# Patient Record
Sex: Male | Born: 1945 | Race: White | Hispanic: No
Health system: Southern US, Community
[De-identification: ages and names within clinical notes are randomized; demographics above are authoritative.]

---

## 2016-06-29 ENCOUNTER — Other Ambulatory Visit (HOSPITAL_COMMUNITY): Payer: Federal, State, Local not specified - PPO

## 2016-06-29 ENCOUNTER — Inpatient Hospital Stay
Admit: 2016-06-29 | Discharge: 2016-08-07 | Disposition: A | Discharge status: Hospice | Payer: Federal, State, Local not specified - PPO | Source: Other Acute Inpatient Hospital | Attending: Internal Medicine | Admitting: Internal Medicine

## 2016-06-29 ENCOUNTER — Ambulatory Visit (HOSPITAL_COMMUNITY)
Admission: AD | Admit: 2016-06-29 | Discharge: 2016-06-29 | Disposition: A | Discharge status: Home / Self Care | Payer: Federal, State, Local not specified - PPO | Source: Other Acute Inpatient Hospital | Attending: Internal Medicine | Admitting: Internal Medicine

## 2016-06-29 DIAGNOSIS — Z9289 Personal history of other medical treatment: Secondary | ICD-10-CM

## 2016-06-29 DIAGNOSIS — R918 Other nonspecific abnormal finding of lung field: Secondary | ICD-10-CM

## 2016-06-29 DIAGNOSIS — T8579XA Infection and inflammatory reaction due to other internal prosthetic devices, implants and grafts, initial encounter: Secondary | ICD-10-CM

## 2016-06-29 DIAGNOSIS — T827XXA Infection and inflammatory reaction due to other cardiac and vascular devices, implants and grafts, initial encounter: Secondary | ICD-10-CM

## 2016-06-29 DIAGNOSIS — J90 Pleural effusion, not elsewhere classified: Secondary | ICD-10-CM

## 2016-06-29 DIAGNOSIS — J189 Pneumonia, unspecified organism: Secondary | ICD-10-CM

## 2016-06-29 DIAGNOSIS — Z4659 Encounter for fitting and adjustment of other gastrointestinal appliance and device: Secondary | ICD-10-CM

## 2016-06-29 DIAGNOSIS — Z992 Dependence on renal dialysis: Secondary | ICD-10-CM

## 2016-06-29 DIAGNOSIS — J811 Chronic pulmonary edema: Secondary | ICD-10-CM

## 2016-06-29 DIAGNOSIS — J9690 Respiratory failure, unspecified, unspecified whether with hypoxia or hypercapnia: Secondary | ICD-10-CM

## 2016-06-29 DIAGNOSIS — R100 Acute abdomen: Secondary | ICD-10-CM

## 2016-06-29 DIAGNOSIS — Z452 Encounter for adjustment and management of vascular access device: Secondary | ICD-10-CM

## 2016-06-29 DIAGNOSIS — R1310 Dysphagia, unspecified: Secondary | ICD-10-CM

## 2016-06-29 DIAGNOSIS — Z931 Gastrostomy status: Secondary | ICD-10-CM

## 2016-06-29 DIAGNOSIS — R609 Edema, unspecified: Secondary | ICD-10-CM

## 2016-06-30 LAB — CBC WITH DIFFERENTIAL/PLATELET
BASOS PCT: 0 %
Basophils Absolute: 0 10*3/uL (ref 0.0–0.1)
EOS ABS: 0.2 10*3/uL (ref 0.0–0.7)
Eosinophils Relative: 1 %
HEMATOCRIT: 24.7 % — AB (ref 39.0–52.0)
Hemoglobin: 7.9 g/dL — ABNORMAL LOW (ref 13.0–17.0)
LYMPHS ABS: 0.9 10*3/uL (ref 0.7–4.0)
Lymphocytes Relative: 5 %
MCH: 28.3 pg (ref 26.0–34.0)
MCHC: 32 g/dL (ref 30.0–36.0)
MCV: 88.5 fL (ref 78.0–100.0)
MONO ABS: 0.8 10*3/uL (ref 0.1–1.0)
MONOS PCT: 4 %
Neutro Abs: 18.1 10*3/uL — ABNORMAL HIGH (ref 1.7–7.7)
Neutrophils Relative %: 90 %
Platelets: 247 10*3/uL (ref 150–400)
RBC: 2.79 MIL/uL — ABNORMAL LOW (ref 4.22–5.81)
RDW: 15.1 % (ref 11.5–15.5)
WBC: 20 10*3/uL — ABNORMAL HIGH (ref 4.0–10.5)

## 2016-06-30 LAB — CK TOTAL AND CKMB (NOT AT ARMC)
CK, MB: 6.1 ng/mL — ABNORMAL HIGH (ref 0.5–5.0)
RELATIVE INDEX: 5.3 — AB (ref 0.0–2.5)
Total CK: 115 U/L (ref 49–397)

## 2016-06-30 LAB — COMPREHENSIVE METABOLIC PANEL
ALBUMIN: 1.7 g/dL — AB (ref 3.5–5.0)
ALT: 12 U/L — ABNORMAL LOW (ref 17–63)
AST: 19 U/L (ref 15–41)
Alkaline Phosphatase: 86 U/L (ref 38–126)
Anion gap: 12 (ref 5–15)
BILIRUBIN TOTAL: 1.1 mg/dL (ref 0.3–1.2)
BUN: 38 mg/dL — ABNORMAL HIGH (ref 6–20)
CO2: 20 mmol/L — ABNORMAL LOW (ref 22–32)
Calcium: 7.9 mg/dL — ABNORMAL LOW (ref 8.9–10.3)
Chloride: 100 mmol/L — ABNORMAL LOW (ref 101–111)
Creatinine, Ser: 2.15 mg/dL — ABNORMAL HIGH (ref 0.61–1.24)
GFR, EST AFRICAN AMERICAN: 34 mL/min — AB (ref >60)
GFR, EST NON AFRICAN AMERICAN: 29 mL/min — AB (ref >60)
Glucose, Bld: 223 mg/dL — ABNORMAL HIGH (ref 65–99)
POTASSIUM: 5.8 mmol/L — AB (ref 3.5–5.1)
Sodium: 132 mmol/L — ABNORMAL LOW (ref 135–145)
TOTAL PROTEIN: 5.6 g/dL — AB (ref 6.5–8.1)

## 2016-06-30 LAB — URINALYSIS, ROUTINE W REFLEX MICROSCOPIC
BILIRUBIN URINE: NEGATIVE
Glucose, UA: 50 mg/dL — AB
Hgb urine dipstick: NEGATIVE
Ketones, ur: NEGATIVE mg/dL
Leukocytes, UA: NEGATIVE
Nitrite: NEGATIVE
Protein, ur: 30 mg/dL — AB
SPECIFIC GRAVITY, URINE: 1.018 (ref 1.005–1.030)
pH: 5 (ref 5.0–8.0)

## 2016-06-30 LAB — BLOOD GAS, ARTERIAL
ACID-BASE DEFICIT: 6.3 mmol/L — AB (ref 0.0–2.0)
BICARBONATE: 18.7 mmol/L — AB (ref 20.0–28.0)
Delivery systems: POSITIVE
EXPIRATORY PAP: 8
FIO2: 50
Inspiratory PAP: 16
Mode: POSITIVE
O2 Saturation: 92.9 %
PH ART: 7.316 — AB (ref 7.350–7.450)
Patient temperature: 98.6
pCO2 arterial: 37.7 mmHg (ref 32.0–48.0)
pO2, Arterial: 72.9 mmHg — ABNORMAL LOW (ref 83.0–108.0)

## 2016-06-30 LAB — TSH: TSH: 1.199 u[IU]/mL (ref 0.350–4.500)

## 2016-06-30 LAB — PROTIME-INR
INR: 1.55
Prothrombin Time: 18.8 seconds — ABNORMAL HIGH (ref 11.4–15.2)

## 2016-06-30 LAB — SEDIMENTATION RATE: SED RATE: 98 mm/h — AB (ref 0–16)

## 2016-06-30 LAB — C-REACTIVE PROTEIN: CRP: 24.8 mg/dL — AB (ref <1.0)

## 2016-06-30 LAB — PHOSPHORUS: Phosphorus: 5.3 mg/dL — ABNORMAL HIGH (ref 2.5–4.6)

## 2016-06-30 LAB — MAGNESIUM: MAGNESIUM: 1.5 mg/dL — AB (ref 1.7–2.4)

## 2016-07-01 ENCOUNTER — Other Ambulatory Visit (HOSPITAL_COMMUNITY): Payer: Federal, State, Local not specified - PPO

## 2016-07-01 ENCOUNTER — Encounter: Payer: Federal, State, Local not specified - PPO | Admitting: Certified Registered Nurse Anesthetist

## 2016-07-01 LAB — BLOOD GAS, ARTERIAL
ACID-BASE DEFICIT: 3.8 mmol/L — AB (ref 0.0–2.0)
ACID-BASE DEFICIT: 5.5 mmol/L — AB (ref 0.0–2.0)
Acid-base deficit: 4.9 mmol/L — ABNORMAL HIGH (ref 0.0–2.0)
BICARBONATE: 20.7 mmol/L (ref 20.0–28.0)
BICARBONATE: 19.7 mmol/L — AB (ref 20.0–28.0)
Bicarbonate: 21 mmol/L (ref 20.0–28.0)
DELIVERY SYSTEMS: POSITIVE
EXPIRATORY PAP: 8
FIO2: 100
FIO2: 100
FIO2: 80
INSPIRATORY PAP: 16
LHR: 18 {breaths}/min
O2 SAT: 94.4 %
O2 Saturation: 96.6 %
O2 Saturation: 98.7 %
PATIENT TEMPERATURE: 98.6
PCO2 ART: 45.2 mmHg (ref 32.0–48.0)
PEEP/CPAP: 12 cmH2O
PEEP: 12 cmH2O
PH ART: 7.339 — AB (ref 7.350–7.450)
PIP: 16 cmH2O
PO2 ART: 83.3 mmHg (ref 83.0–108.0)
PRESSURE CONTROL: 16 cmH2O
Patient temperature: 98.6
Patient temperature: 98.6
RATE: 24 resp/min
pCO2 arterial: 40.1 mmHg (ref 32.0–48.0)
pCO2 arterial: 40.1 mmHg (ref 32.0–48.0)
pH, Arterial: 7.282 — ABNORMAL LOW (ref 7.350–7.450)
pH, Arterial: 7.312 — ABNORMAL LOW (ref 7.350–7.450)
pO2, Arterial: 98.5 mmHg (ref 83.0–108.0)
pO2, Arterial: 141 mmHg — ABNORMAL HIGH (ref 83.0–108.0)

## 2016-07-01 LAB — CBC WITH DIFFERENTIAL/PLATELET
Basophils Absolute: 0 10*3/uL (ref 0.0–0.1)
Basophils Relative: 0 %
EOS ABS: 0 10*3/uL (ref 0.0–0.7)
Eosinophils Relative: 0 %
HCT: 27 % — ABNORMAL LOW (ref 39.0–52.0)
HEMOGLOBIN: 8.6 g/dL — AB (ref 13.0–17.0)
LYMPHS ABS: 0.6 10*3/uL — AB (ref 0.7–4.0)
LYMPHS PCT: 3 %
MCH: 27.9 pg (ref 26.0–34.0)
MCHC: 31.9 g/dL (ref 30.0–36.0)
MCV: 87.7 fL (ref 78.0–100.0)
Monocytes Absolute: 1.1 10*3/uL — ABNORMAL HIGH (ref 0.1–1.0)
Monocytes Relative: 6 %
NEUTROS PCT: 91 %
Neutro Abs: 17.2 10*3/uL — ABNORMAL HIGH (ref 1.7–7.7)
Platelets: 311 10*3/uL (ref 150–400)
RBC: 3.08 MIL/uL — AB (ref 4.22–5.81)
RDW: 15.4 % (ref 11.5–15.5)
WBC: 19 10*3/uL — AB (ref 4.0–10.5)

## 2016-07-01 LAB — RENAL FUNCTION PANEL
ANION GAP: 14 (ref 5–15)
Albumin: 1.7 g/dL — ABNORMAL LOW (ref 3.5–5.0)
BUN: 53 mg/dL — ABNORMAL HIGH (ref 6–20)
CALCIUM: 8 mg/dL — AB (ref 8.9–10.3)
CO2: 19 mmol/L — AB (ref 22–32)
Chloride: 102 mmol/L (ref 101–111)
Creatinine, Ser: 2.3 mg/dL — ABNORMAL HIGH (ref 0.61–1.24)
GFR, EST AFRICAN AMERICAN: 31 mL/min — AB (ref >60)
GFR, EST NON AFRICAN AMERICAN: 27 mL/min — AB (ref >60)
Glucose, Bld: 272 mg/dL — ABNORMAL HIGH (ref 65–99)
PHOSPHORUS: 5.5 mg/dL — AB (ref 2.5–4.6)
Potassium: 4.4 mmol/L (ref 3.5–5.1)
SODIUM: 135 mmol/L (ref 135–145)

## 2016-07-01 LAB — URINE CULTURE: Culture: NO GROWTH

## 2016-07-01 LAB — HEMOGLOBIN A1C
Hgb A1c MFr Bld: 7.3 % — ABNORMAL HIGH (ref 4.8–5.6)
MEAN PLASMA GLUCOSE: 163 mg/dL

## 2016-07-01 LAB — MAGNESIUM: MAGNESIUM: 1.8 mg/dL (ref 1.7–2.4)

## 2016-07-01 MED ORDER — ETOMIDATE 2 MG/ML IV SOLN
INTRAVENOUS | Status: DC | PRN
  Administered 2016-07-01: 12 mg via INTRAVENOUS

## 2016-07-01 MED ORDER — SUCCINYLCHOLINE CHLORIDE 20 MG/ML IJ SOLN
INTRAMUSCULAR | Status: DC | PRN
  Administered 2016-07-01: 100 mg via INTRAVENOUS

## 2016-07-02 LAB — BLOOD GAS, ARTERIAL
ACID-BASE DEFICIT: 3.1 mmol/L — AB (ref 0.0–2.0)
Bicarbonate: 22.1 mmol/L (ref 20.0–28.0)
FIO2: 70
O2 SAT: 89.1 %
PEEP: 12 cmH2O
PH ART: 7.315 — AB (ref 7.350–7.450)
Patient temperature: 98.6
Pressure control: 16 cmH2O
RATE: 24 resp/min
pCO2 arterial: 44.6 mmHg (ref 32.0–48.0)
pO2, Arterial: 63.4 mmHg — ABNORMAL LOW (ref 83.0–108.0)

## 2016-07-02 LAB — RENAL FUNCTION PANEL
ALBUMIN: 1.5 g/dL — AB (ref 3.5–5.0)
ANION GAP: 11 (ref 5–15)
BUN: 65 mg/dL — AB (ref 6–20)
CALCIUM: 7.7 mg/dL — AB (ref 8.9–10.3)
CO2: 21 mmol/L — ABNORMAL LOW (ref 22–32)
Chloride: 102 mmol/L (ref 101–111)
Creatinine, Ser: 2.41 mg/dL — ABNORMAL HIGH (ref 0.61–1.24)
GFR calc Af Amer: 30 mL/min — ABNORMAL LOW (ref >60)
GFR, EST NON AFRICAN AMERICAN: 26 mL/min — AB (ref >60)
GLUCOSE: 367 mg/dL — AB (ref 65–99)
PHOSPHORUS: 5.7 mg/dL — AB (ref 2.5–4.6)
POTASSIUM: 4.4 mmol/L (ref 3.5–5.1)
SODIUM: 134 mmol/L — AB (ref 135–145)

## 2016-07-02 LAB — MAGNESIUM: Magnesium: 1.8 mg/dL (ref 1.7–2.4)

## 2016-07-02 LAB — CBC WITH DIFFERENTIAL/PLATELET
BASOS ABS: 0 10*3/uL (ref 0.0–0.1)
BASOS PCT: 0 %
Eosinophils Absolute: 0.2 10*3/uL (ref 0.0–0.7)
Eosinophils Relative: 1 %
HEMATOCRIT: 23.4 % — AB (ref 39.0–52.0)
HEMOGLOBIN: 7.4 g/dL — AB (ref 13.0–17.0)
LYMPHS PCT: 2 %
Lymphs Abs: 0.3 10*3/uL — ABNORMAL LOW (ref 0.7–4.0)
MCH: 27.7 pg (ref 26.0–34.0)
MCHC: 31.6 g/dL (ref 30.0–36.0)
MCV: 87.6 fL (ref 78.0–100.0)
Monocytes Absolute: 0.9 10*3/uL (ref 0.1–1.0)
Monocytes Relative: 6 %
NEUTROS PCT: 91 %
Neutro Abs: 14.6 10*3/uL — ABNORMAL HIGH (ref 1.7–7.7)
Platelets: 267 10*3/uL (ref 150–400)
RBC: 2.67 MIL/uL — AB (ref 4.22–5.81)
RDW: 15.5 % (ref 11.5–15.5)
WBC: 16.1 10*3/uL — AB (ref 4.0–10.5)

## 2016-07-03 ENCOUNTER — Other Ambulatory Visit (HOSPITAL_COMMUNITY): Payer: Federal, State, Local not specified - PPO

## 2016-07-03 LAB — BASIC METABOLIC PANEL
ANION GAP: 12 (ref 5–15)
BUN: 76 mg/dL — ABNORMAL HIGH (ref 6–20)
CALCIUM: 8.1 mg/dL — AB (ref 8.9–10.3)
CO2: 22 mmol/L (ref 22–32)
Chloride: 101 mmol/L (ref 101–111)
Creatinine, Ser: 2.76 mg/dL — ABNORMAL HIGH (ref 0.61–1.24)
GFR, EST AFRICAN AMERICAN: 25 mL/min — AB (ref >60)
GFR, EST NON AFRICAN AMERICAN: 22 mL/min — AB (ref >60)
Glucose, Bld: 388 mg/dL — ABNORMAL HIGH (ref 65–99)
Potassium: 5 mmol/L (ref 3.5–5.1)
SODIUM: 135 mmol/L (ref 135–145)

## 2016-07-03 LAB — BLOOD GAS, ARTERIAL
ACID-BASE DEFICIT: 2.7 mmol/L — AB (ref 0.0–2.0)
Acid-base deficit: 2.4 mmol/L — ABNORMAL HIGH (ref 0.0–2.0)
BICARBONATE: 22.1 mmol/L (ref 20.0–28.0)
Bicarbonate: 23.2 mmol/L (ref 20.0–28.0)
FIO2: 100
FIO2: 100
O2 Saturation: 90.4 %
O2 Saturation: 93.8 %
PATIENT TEMPERATURE: 98.6
PCO2 ART: 50 mmHg — AB (ref 32.0–48.0)
PEEP: 12 cmH2O
PEEP: 12 cmH2O
PIP: 24 cmH2O
Patient temperature: 98.6
RATE: 24 resp/min
RATE: 28 resp/min
pCO2 arterial: 41.2 mmHg (ref 32.0–48.0)
pH, Arterial: 7.288 — ABNORMAL LOW (ref 7.350–7.450)
pH, Arterial: 7.348 — ABNORMAL LOW (ref 7.350–7.450)
pO2, Arterial: 67.5 mmHg — ABNORMAL LOW (ref 83.0–108.0)
pO2, Arterial: 73.1 mmHg — ABNORMAL LOW (ref 83.0–108.0)

## 2016-07-03 LAB — MAGNESIUM: MAGNESIUM: 2 mg/dL (ref 1.7–2.4)

## 2016-07-03 NOTE — Consult Note (Signed)
Date: 07/03/2016                  Patient Name:  Taylor Maldonado  MRN: [id number]  DOB: 18-Mar-1946  Age / Sex: 71 y.o., male         PCP: No primary care provider on file.                 Service Requesting Consult: Internal Medicine/ [facility]                 Reason for Consult: Acute renal failure            History of Present Illness: Patient is a 71 y.o. male with medical problems of A Fib, Diabetes with complications of retinopathy, GERD, MAI (04/2016), OSA, h/o back surgery, osteomyelitis of left foot who was admitted  on 06/[facility] of Rt BKA   Patient has has chronic bilateral lower extremity wounds and peripheral vasclar disease. He underwent rt BKA for a non healing rt LE wound. Post op course was complicated by respiratory failure vs ARDS. He underwent thoracentesis for large pleural effusion.   Patient has h/o single functioning kidney. d/c Creatinine was 1.27   Urgent consult is requested today for ARF and acute respiratiry failure, Patient was intubated for respiratory distress. He is currently on lasix drip. Fio2 is 100% and PEEP had to be increased to 15    Medications: Outpatient medications: No prescriptions prior to admission.    Current medications: No current facility-administered medications for this encounter.     Atorvastatin azithromycin Buspirone Carvedilol Clonidine Diltiazem Cubicin Ethambutol Famotidine Ferrous sulfate Lasix iv infusion at 12 mg/hr Hydralazine Lantus Miralax NTG Sertraline Trazodone Zinc Sulfate  Allergies: Allergies not on file   Codeine, clindamycin, percocet, sulfa, aspirin, Keflex, augmentin  Past Medical History: No past medical history on file. As above  Past Surgical History: No past surgical history on file.   Family History: No family history on file.   Social History: Social History   Social History  . Marital  status: Unknown    Spouse name: N/A  . Number of children: N/A  . Years of education: N/A   Occupational History  . Not on file.   Social History Main Topics  . Smoking status: Not on file  . Smokeless tobacco: Not on file  . Alcohol use Not on file  . Drug use: Unknown  . Sexual activity: Not on file   Other Topics Concern  . Not on file   Social History Narrative  . No narrative on file     Review of Systems: not available as patient is critically ill intubated and sedated Gen:  HEENT:  CV:  Resp:  GI: GU :  MS:  Derm:   Psych: Heme:  Neuro:  Endocrine  Vital Signs:  T 99 P 81  RR 24  BP 162/75  Weight trends: There were no vitals filed for this visit.  Physical Exam: General:  Critically ill appearing gentleman, laying in bed  HEENT ETT, OGT in place  Neck: No mass  Lungs: Vent assisted, Fio2 100%, PEEP 5, coarse, diminished at bases  Heart::  no rub  Abdomen: Soft, non tender, non distended,   Extremities:  ++ dependent edema, rt BKA, staples intact  Neurologic: sedated  Skin: No acute rashes  Access: -   Foley: Present       Lab results: Basic  Metabolic Panel:  Recent Labs Lab 06/30/16 0450 07/01/16 0652 07/02/16 0713 07/03/16 1042  NA 132* 135 134* 135  K 5.8* 4.4 4.4 5.0  CL 100* 102 102 101  CO2 20* 19* 21* 22  GLUCOSE 223* 272* 367* 388*  BUN 38* 53* 65* 76*  CREATININE 2.15* 2.30* 2.41* 2.76*  CALCIUM 7.9* 8.0* 7.7* 8.1*  MG 1.5* 1.8 1.8 2.0  PHOS 5.3* 5.5* 5.7*  --     Liver Function Tests:  Recent Labs Lab 06/30/16 0450  07/02/16 0713  AST 19  --   --   ALT 12*  --   --   ALKPHOS 86  --   --   BILITOT 1.1  --   --   PROT 5.6*  --   --   ALBUMIN 1.7*  < > 1.5*  < > = values in this interval not displayed. No results for input(s): LIPASE, AMYLASE in the last 168 hours. No results for input(s): AMMONIA in the last 168 hours.  CBC:  Recent Labs Lab 07/01/16 0652 07/02/16 0713  WBC 19.0* 16.1*  NEUTROABS  17.2* 14.6*  HGB 8.6* 7.4*  HCT 27.0* 23.4*  MCV 87.7 87.6  PLT 311 267    Cardiac Enzymes:  Recent Labs Lab 06/30/16 0450  CKTOTAL 115    BNP: Invalid input(s): POCBNP  CBG: No results for input(s): GLUCAP in the last 168 hours.  Microbiology: Recent Results (from the past 720 hour(s))  Culture, blood (routine x 2)     Status: None (Preliminary result)   Collection Time: 06/29/16  6:12 PM  Result Value Ref Range Status   Specimen Description BLOOD LEFT HAND  Final   Special Requests IN PEDIATRIC BOTTLE 1CC  Final   Culture NO GROWTH 4 DAYS  Final   Report Status PENDING  Incomplete  Culture, blood (routine x 2)     Status: None (Preliminary result)   Collection Time: 06/29/16  6:23 PM  Result Value Ref Range Status   Specimen Description BLOOD RIGHT HAND  Final   Special Requests BOTTLES DRAWN AEROBIC AND ANAEROBIC 5CC  Final   Culture NO GROWTH 4 DAYS  Final   Report Status PENDING  Incomplete  Urine culture     Status: None   Collection Time: 06/30/16  7:39 AM  Result Value Ref Range Status   Specimen Description URINE, RANDOM  Final   Special Requests NONE  Final   Culture NO GROWTH  Final   Report Status 07/01/2016 FINAL  Final     Coagulation Studies: No results for input(s): LABPROT, INR in the last 72 hours.  Urinalysis: No results for input(s): COLORURINE, LABSPEC, PHURINE, GLUCOSEU, HGBUR, BILIRUBINUR, KETONESUR, PROTEINUR, UROBILINOGEN, NITRITE, LEUKOCYTESUR in the last 72 hours.  Invalid input(s): APPERANCEUR      Imaging: Dg Chest Port 1 View  Result Date: 07/03/2016 CLINICAL DATA:  Endotracheal tube.  Pulmonary edema. EXAM: PORTABLE CHEST 1 VIEW COMPARISON:  One-view chest x-ray 07/01/2016 FINDINGS: Endotracheal tube now terminates 3.3 cm above the carina, in satisfactory position. Right upper lobe airspace consolidation has progressed. Right lower lobe airspace disease is worse as well. There is increased diffuse edema. The heart is enlarged.  Bilateral pleural effusions have progressed. A left-sided PICC line is stable in position. There is a loop that extends superiorly within the left innominate vein. The tip is directed caudal. IMPRESSION: 1. Increasing interstitial and airspace disease as well is bilateral pleural effusions with in the setting of cardiac enlargement compatible with  congestive heart failure. 2. The endotracheal tube now terminates 3.3 cm above carina, in satisfactory position. 3. The left-sided PICC line remains looped within the innominate vein. 4. Progressive right upper lower lobe airspace consolidation concerning for pneumonia. Electronically Signed   By: San Morelle M.D.   On: 07/03/2016 10:04      Assessment & Plan: Pt is a 71 y.o. 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   1. Acute Renal failure / Solitary rt Pelvic Kidney 2. CKD st 3. Baseline Cr 1.27 (d/c from [facility]) Jun 29, 2016 3. B/L Pleural effusions and Acute respiratory failure requiring ventilator support 4. Anasarca - Alb 1.5 - 3rd spacing of fluid  Patient is critically ill and had to be placed on ventilator for respiratory distress. He is responding some to iv lasix infusion but still requiring significant ventilator support. We have been requested by [facility] team and Dr Osvaldo Angst Khan/ Pulm Critical care to consider urgent HD for volume overload It is reasonable to consider one time dialysis to relieve some of the burden of volume overload although majority of the problem seems to be due to 3rd spacing of fluid from low albumin (likely from chronic wounds and lung infection) leading to significant pleural effusions and also from  pneumonia   Plan VIR has been consulted for temporary dialysis cathter placement Family has provided consent to do everything necessary to sustain his life Will order a short dialysis treatment of 2 hrs with cautious fluid removal of 1.5-2 L Need for further dialysis can be assessed based on re-evaluation on Monday In the meantime, continue lasix infusion and follow UOP Consider iv albumin infusion to increase oncotic pressure  Will follow

## 2016-07-04 ENCOUNTER — Other Ambulatory Visit (HOSPITAL_COMMUNITY): Payer: Federal, State, Local not specified - PPO

## 2016-07-04 LAB — CBC WITH DIFFERENTIAL/PLATELET
Basophils Absolute: 0 10*3/uL (ref 0.0–0.1)
Basophils Relative: 0 %
EOS PCT: 2 %
Eosinophils Absolute: 0.4 10*3/uL (ref 0.0–0.7)
HEMATOCRIT: 23.3 % — AB (ref 39.0–52.0)
HEMOGLOBIN: 7.4 g/dL — AB (ref 13.0–17.0)
Lymphocytes Relative: 5 %
Lymphs Abs: 0.9 10*3/uL (ref 0.7–4.0)
MCH: 27.7 pg (ref 26.0–34.0)
MCHC: 31.8 g/dL (ref 30.0–36.0)
MCV: 87.3 fL (ref 78.0–100.0)
Monocytes Absolute: 0.8 10*3/uL (ref 0.1–1.0)
Monocytes Relative: 5 %
NEUTROS ABS: 15.4 10*3/uL — AB (ref 1.7–7.7)
Neutrophils Relative %: 88 %
PLATELETS: 252 10*3/uL (ref 150–400)
RBC: 2.67 MIL/uL — AB (ref 4.22–5.81)
RDW: 15.9 % — ABNORMAL HIGH (ref 11.5–15.5)
WBC: 18 10*3/uL — AB (ref 4.0–10.5)

## 2016-07-04 LAB — RENAL FUNCTION PANEL
ANION GAP: 11 (ref 5–15)
Albumin: 1.3 g/dL — ABNORMAL LOW (ref 3.5–5.0)
BUN: 85 mg/dL — ABNORMAL HIGH (ref 6–20)
CHLORIDE: 102 mmol/L (ref 101–111)
CO2: 22 mmol/L (ref 22–32)
Calcium: 7.9 mg/dL — ABNORMAL LOW (ref 8.9–10.3)
Creatinine, Ser: 3.11 mg/dL — ABNORMAL HIGH (ref 0.61–1.24)
GFR, EST AFRICAN AMERICAN: 22 mL/min — AB (ref >60)
GFR, EST NON AFRICAN AMERICAN: 19 mL/min — AB (ref >60)
Glucose, Bld: 357 mg/dL — ABNORMAL HIGH (ref 65–99)
Phosphorus: 5 mg/dL — ABNORMAL HIGH (ref 2.5–4.6)
Potassium: 4.9 mmol/L (ref 3.5–5.1)
Sodium: 135 mmol/L (ref 135–145)

## 2016-07-04 LAB — URINALYSIS, ROUTINE W REFLEX MICROSCOPIC
Bilirubin Urine: NEGATIVE
GLUCOSE, UA: 50 mg/dL — AB
Hgb urine dipstick: NEGATIVE
Ketones, ur: NEGATIVE mg/dL
Nitrite: NEGATIVE
PH: 5 (ref 5.0–8.0)
PROTEIN: 30 mg/dL — AB
Specific Gravity, Urine: 1.018 (ref 1.005–1.030)

## 2016-07-04 LAB — CULTURE, BLOOD (ROUTINE X 2)
Culture: NO GROWTH
Culture: NO GROWTH

## 2016-07-04 LAB — HEPATITIS B CORE ANTIBODY, IGM: Hep B C IgM: NEGATIVE

## 2016-07-04 LAB — VANCOMYCIN, TROUGH

## 2016-07-04 LAB — MAGNESIUM: MAGNESIUM: 2 mg/dL (ref 1.7–2.4)

## 2016-07-04 LAB — HEPATITIS B SURFACE ANTIBODY, QUANTITATIVE: Hep B S AB Quant (Post): <3.1 m[IU]/mL — ABNORMAL LOW (ref >9.9)

## 2016-07-05 ENCOUNTER — Other Ambulatory Visit (HOSPITAL_COMMUNITY): Payer: Federal, State, Local not specified - PPO

## 2016-07-05 LAB — HEPATITIS B E ANTIBODY: Hep B E Ab: NEGATIVE

## 2016-07-05 LAB — RENAL FUNCTION PANEL
Albumin: 1.4 g/dL — ABNORMAL LOW (ref 3.5–5.0)
Anion gap: 13 (ref 5–15)
BUN: 100 mg/dL — AB (ref 6–20)
CO2: 20 mmol/L — AB (ref 22–32)
Calcium: 8 mg/dL — ABNORMAL LOW (ref 8.9–10.3)
Chloride: 101 mmol/L (ref 101–111)
Creatinine, Ser: 3.53 mg/dL — ABNORMAL HIGH (ref 0.61–1.24)
GFR calc Af Amer: 19 mL/min — ABNORMAL LOW (ref >60)
GFR, EST NON AFRICAN AMERICAN: 16 mL/min — AB (ref >60)
Glucose, Bld: 402 mg/dL — ABNORMAL HIGH (ref 65–99)
POTASSIUM: 5.3 mmol/L — AB (ref 3.5–5.1)
Phosphorus: 6.3 mg/dL — ABNORMAL HIGH (ref 2.5–4.6)
Sodium: 134 mmol/L — ABNORMAL LOW (ref 135–145)

## 2016-07-05 LAB — BLOOD GAS, ARTERIAL
Acid-base deficit: 3 mmol/L — ABNORMAL HIGH (ref 0.0–2.0)
BICARBONATE: 21.9 mmol/L (ref 20.0–28.0)
Drawn by: 30136
FIO2: 70
LHR: 28 {breaths}/min
O2 SAT: 98.6 %
PATIENT TEMPERATURE: 98.4
PCO2 ART: 41.3 mmHg (ref 32.0–48.0)
PEEP/CPAP: 15 cmH2O
PH ART: 7.343 — AB (ref 7.350–7.450)
Pressure control: 24 cmH2O
pO2, Arterial: 139 mmHg — ABNORMAL HIGH (ref 83.0–108.0)

## 2016-07-05 LAB — CBC WITH DIFFERENTIAL/PLATELET
Basophils Absolute: 0 10*3/uL (ref 0.0–0.1)
Basophils Relative: 0 %
EOS PCT: 2 %
Eosinophils Absolute: 0.4 10*3/uL (ref 0.0–0.7)
HCT: 24 % — ABNORMAL LOW (ref 39.0–52.0)
Hemoglobin: 7.6 g/dL — ABNORMAL LOW (ref 13.0–17.0)
LYMPHS ABS: 0.7 10*3/uL (ref 0.7–4.0)
LYMPHS PCT: 4 %
MCH: 27.9 pg (ref 26.0–34.0)
MCHC: 31.7 g/dL (ref 30.0–36.0)
MCV: 88.2 fL (ref 78.0–100.0)
MONO ABS: 1.4 10*3/uL — AB (ref 0.1–1.0)
MONOS PCT: 7 %
Neutro Abs: 17 10*3/uL — ABNORMAL HIGH (ref 1.7–7.7)
Neutrophils Relative %: 87 %
PLATELETS: 226 10*3/uL (ref 150–400)
RBC: 2.72 MIL/uL — AB (ref 4.22–5.81)
RDW: 16.2 % — ABNORMAL HIGH (ref 11.5–15.5)
WBC: 19.6 10*3/uL — AB (ref 4.0–10.5)

## 2016-07-05 LAB — URINE CULTURE

## 2016-07-05 LAB — MAGNESIUM: Magnesium: 2 mg/dL (ref 1.7–2.4)

## 2016-07-06 ENCOUNTER — Other Ambulatory Visit (HOSPITAL_COMMUNITY): Payer: Federal, State, Local not specified - PPO

## 2016-07-06 ENCOUNTER — Encounter (HOSPITAL_COMMUNITY): Payer: Self-pay | Admitting: Interventional Radiology

## 2016-07-06 ENCOUNTER — Institutional Professional Consult (permissible substitution) (HOSPITAL_COMMUNITY): Payer: Federal, State, Local not specified - PPO

## 2016-07-06 HISTORY — PX: IR GENERIC HISTORICAL: IMG1180011

## 2016-07-06 LAB — CULTURE, RESPIRATORY

## 2016-07-06 LAB — RENAL FUNCTION PANEL
ANION GAP: 12 (ref 5–15)
Albumin: 1.4 g/dL — ABNORMAL LOW (ref 3.5–5.0)
BUN: 103 mg/dL — ABNORMAL HIGH (ref 6–20)
CHLORIDE: 103 mmol/L (ref 101–111)
CO2: 22 mmol/L (ref 22–32)
CREATININE: 3.67 mg/dL — AB (ref 0.61–1.24)
Calcium: 8 mg/dL — ABNORMAL LOW (ref 8.9–10.3)
GFR calc non Af Amer: 15 mL/min — ABNORMAL LOW (ref >60)
GFR, EST AFRICAN AMERICAN: 18 mL/min — AB (ref >60)
GLUCOSE: 285 mg/dL — AB (ref 65–99)
Phosphorus: 5.8 mg/dL — ABNORMAL HIGH (ref 2.5–4.6)
Potassium: 4.2 mmol/L (ref 3.5–5.1)
SODIUM: 137 mmol/L (ref 135–145)

## 2016-07-06 LAB — CBC WITH DIFFERENTIAL/PLATELET
Basophils Absolute: 0 10*3/uL (ref 0.0–0.1)
Basophils Relative: 0 %
EOS ABS: 0.2 10*3/uL (ref 0.0–0.7)
Eosinophils Relative: 1 %
HCT: 24.3 % — ABNORMAL LOW (ref 39.0–52.0)
Hemoglobin: 7.7 g/dL — ABNORMAL LOW (ref 13.0–17.0)
LYMPHS ABS: 1 10*3/uL (ref 0.7–4.0)
Lymphocytes Relative: 5 %
MCH: 27.6 pg (ref 26.0–34.0)
MCHC: 31.7 g/dL (ref 30.0–36.0)
MCV: 87.1 fL (ref 78.0–100.0)
MONOS PCT: 2 %
Monocytes Absolute: 0.3 10*3/uL (ref 0.1–1.0)
Neutro Abs: 17.2 10*3/uL — ABNORMAL HIGH (ref 1.7–7.7)
Neutrophils Relative %: 92 %
PLATELETS: 256 10*3/uL (ref 150–400)
RBC: 2.79 MIL/uL — ABNORMAL LOW (ref 4.22–5.81)
RDW: 15.7 % — AB (ref 11.5–15.5)
WBC: 18.7 10*3/uL — ABNORMAL HIGH (ref 4.0–10.5)

## 2016-07-06 LAB — MAGNESIUM: MAGNESIUM: 2.1 mg/dL (ref 1.7–2.4)

## 2016-07-06 LAB — CULTURE, RESPIRATORY W GRAM STAIN

## 2016-07-06 MED ORDER — HEPARIN SODIUM (PORCINE) 1000 UNIT/ML IJ SOLN
INTRAMUSCULAR | Status: AC
  Filled 2016-07-06: qty 1

## 2016-07-06 MED ORDER — LIDOCAINE HCL (PF) 1 % IJ SOLN
INTRAMUSCULAR | Status: AC
  Filled 2016-07-06: qty 10

## 2016-07-06 NOTE — CV Procedure (Signed)
Interventional Radiology Procedure Note  Procedure: Placement of a Right IJ 20 cm [place]kar non tunneled HD catheter with additional CVC port. Tip location:  Upper RA.  Complications: None  Estimated Blood Loss: None  Recommendations: - Routine line care.  Signed,  S[facility]ling [facility] K. Brihana Quickel, MD

## 2016-07-07 LAB — BLOOD GAS, ARTERIAL
Acid-Base Excess: 1.1 mmol/L (ref 0.0–2.0)
BICARBONATE: 26 mmol/L (ref 20.0–28.0)
FIO2: 50
LHR: 28 {breaths}/min
O2 Saturation: 96.9 %
PEEP/CPAP: 12 cmH2O
Patient temperature: 98.6
Pressure control: 24 cmH2O
pCO2 arterial: 48.3 mmHg — ABNORMAL HIGH (ref 32.0–48.0)
pH, Arterial: 7.351 (ref 7.350–7.450)
pO2, Arterial: 96.1 mmHg (ref 83.0–108.0)

## 2016-07-07 LAB — RENAL FUNCTION PANEL
ALBUMIN: 1.4 g/dL — AB (ref 3.5–5.0)
Anion gap: 11 (ref 5–15)
BUN: 105 mg/dL — ABNORMAL HIGH (ref 6–20)
CALCIUM: 8 mg/dL — AB (ref 8.9–10.3)
CHLORIDE: 104 mmol/L (ref 101–111)
CO2: 24 mmol/L (ref 22–32)
CREATININE: 3.63 mg/dL — AB (ref 0.61–1.24)
GFR, EST AFRICAN AMERICAN: 18 mL/min — AB (ref >60)
GFR, EST NON AFRICAN AMERICAN: 16 mL/min — AB (ref >60)
Glucose, Bld: 232 mg/dL — ABNORMAL HIGH (ref 65–99)
PHOSPHORUS: 6.2 mg/dL — AB (ref 2.5–4.6)
Potassium: 4.4 mmol/L (ref 3.5–5.1)
SODIUM: 139 mmol/L (ref 135–145)

## 2016-07-07 LAB — CBC
HCT: 24.1 % — ABNORMAL LOW (ref 39.0–52.0)
Hemoglobin: 7.5 g/dL — ABNORMAL LOW (ref 13.0–17.0)
MCH: 27.4 pg (ref 26.0–34.0)
MCHC: 31.1 g/dL (ref 30.0–36.0)
MCV: 88 fL (ref 78.0–100.0)
PLATELETS: 234 10*3/uL (ref 150–400)
RBC: 2.74 MIL/uL — AB (ref 4.22–5.81)
RDW: 16.1 % — ABNORMAL HIGH (ref 11.5–15.5)
WBC: 13.6 10*3/uL — AB (ref 4.0–10.5)

## 2016-07-08 ENCOUNTER — Institutional Professional Consult (permissible substitution) (HOSPITAL_COMMUNITY): Payer: Federal, State, Local not specified - PPO

## 2016-07-08 ENCOUNTER — Other Ambulatory Visit (HOSPITAL_COMMUNITY): Payer: Federal, State, Local not specified - PPO

## 2016-07-08 LAB — CBC WITH DIFFERENTIAL/PLATELET
Basophils Absolute: 0.1 10*3/uL (ref 0.0–0.1)
Basophils Relative: 1 %
EOS PCT: 1 %
Eosinophils Absolute: 0.1 10*3/uL (ref 0.0–0.7)
HEMATOCRIT: 25.5 % — AB (ref 39.0–52.0)
HEMOGLOBIN: 8 g/dL — AB (ref 13.0–17.0)
LYMPHS ABS: 0.9 10*3/uL (ref 0.7–4.0)
Lymphocytes Relative: 6 %
MCH: 27.4 pg (ref 26.0–34.0)
MCHC: 31.4 g/dL (ref 30.0–36.0)
MCV: 87.3 fL (ref 78.0–100.0)
MONOS PCT: 8 %
Monocytes Absolute: 1.1 10*3/uL — ABNORMAL HIGH (ref 0.1–1.0)
NEUTROS ABS: 12.1 10*3/uL — AB (ref 1.7–7.7)
Neutrophils Relative %: 84 %
Platelets: 234 10*3/uL (ref 150–400)
RBC: 2.92 MIL/uL — ABNORMAL LOW (ref 4.22–5.81)
RDW: 15.6 % — ABNORMAL HIGH (ref 11.5–15.5)
WBC: 14.3 10*3/uL — ABNORMAL HIGH (ref 4.0–10.5)

## 2016-07-08 LAB — MAGNESIUM: Magnesium: 2 mg/dL (ref 1.7–2.4)

## 2016-07-08 LAB — RENAL FUNCTION PANEL
ANION GAP: 13 (ref 5–15)
Albumin: 1.5 g/dL — ABNORMAL LOW (ref 3.5–5.0)
BUN: 104 mg/dL — ABNORMAL HIGH (ref 6–20)
CHLORIDE: 101 mmol/L (ref 101–111)
CO2: 23 mmol/L (ref 22–32)
Calcium: 7.7 mg/dL — ABNORMAL LOW (ref 8.9–10.3)
Creatinine, Ser: 3.62 mg/dL — ABNORMAL HIGH (ref 0.61–1.24)
GFR calc Af Amer: 18 mL/min — ABNORMAL LOW (ref >60)
GFR calc non Af Amer: 16 mL/min — ABNORMAL LOW (ref >60)
GLUCOSE: 378 mg/dL — AB (ref 65–99)
PHOSPHORUS: 5.1 mg/dL — AB (ref 2.5–4.6)
POTASSIUM: 4.5 mmol/L (ref 3.5–5.1)
Sodium: 137 mmol/L (ref 135–145)

## 2016-07-08 LAB — HEPATITIS B SURFACE ANTIBODY,QUALITATIVE: HEP B S AB: NONREACTIVE

## 2016-07-08 LAB — HEPATITIS B SURFACE ANTIGEN: Hepatitis B Surface Ag: NEGATIVE

## 2016-07-08 NOTE — Progress Notes (Signed)
[facility]  ROUNDING NOTE   Subjective:  Patient had dialysis on Monday and yesterday. Remains on the ventilator and continues to have pulmonary edema. Pulmonary/critical care following on the case.   Objective:  Vital signs in last 24 hours:  Temperature 101.5 pulse 1:30 respirations 31 blood pressure 168/76  Physical Exam: General: Critically ill appearing  Head: ETT in place  Eyes: Anicteric  Neck: Supple, trachea midline  Lungs:  Bilateral rhonchi, vent assisted  Heart: S1S2 no rubs  Abdomen:  Soft, nontender, bowel sounds present  Extremities: Right BKA, 2+ b/l LE edema  Neurologic: Not following commands  Skin: No lesions  Access: R IJ temporary dialysis catheter    Basic Metabolic Panel:  Recent Labs Lab 07/03/16 1042 07/04/16 0500 07/05/16 0609 07/06/16 0740 07/07/16 0600 07/08/16 0722  NA 135 135 134* 137 139 137  K 5.0 4.9 5.3* 4.2 4.4 4.5  CL 101 102 101 103 104 101  CO2 22 22 20* _0 GLUCOSE 388* 357* 402* 285* 232* 378*  BUN 76* 85* 100* 103* 105* 104*  CREATININE 2.76* 3.11* 3.53* 3.67* 3.63* 3.62*  CALCIUM 8.1* 7.9* 8.0* 8.0* 8.0* 7.7*  MG 2.0 2.0 2.0 2.1  --  2.0  PHOS  --  5.0* 6.3* 5.8* 6.2* 5.1*    Liver Function Tests:  Recent Labs Lab 07/04/16 0500 07/05/16 0609 07/06/16 0740 07/07/16 0600 07/08/16 0722  ALBUMIN 1.3* 1.4* 1.4* 1.4* 1.5*   No results for input(s): LIPASE, AMYLASE in the last 168 hours. No results for input(s): AMMONIA in the last 168 hours.  CBC:  Recent Labs Lab 07/02/16 0713 07/04/16 0500 07/05/16 0609 07/06/16 0740 07/07/16 0600 07/08/16 0722  WBC 16.1* 18.0* 19.6* 18.7* 13.6* 14.3*  NEUTROABS 14.6* 15.4* 17.0* 17.2*  --  12.1*  HGB 7.4* 7.4* 7.6* 7.7* 7.5* 8.0*  HCT 23.4* 23.3* 24.0* 24.3* 24.1* 25.5*  MCV 87.6 87.3 88.2 87.1 88.0 87.3  PLT 267 252 226 256 234 234    Cardiac Enzymes: No results for input(s): CKTOTAL, CKMB, CKMBINDEX, TROPONINI in the last 168  hours.  BNP: Invalid input(s): POCBNP  CBG: No results for input(s): GLUCAP in the last 168 hours.  Microbiology: Results for orders placed or performed during the hospital encounter of 06/29/16  Culture, blood (routine x 2)     Status: None   Collection Time: 06/29/16  6:12 PM  Result Value Ref Range Status   Specimen Description BLOOD LEFT HAND  Final   Special Requests IN PEDIATRIC BOTTLE [place]  Final   Culture NO GROWTH 5 DAYS  Final   Report Status 07/04/2016 FINAL  Final  Culture, blood (routine x 2)     Status: None   Collection Time: 06/29/16  6:23 PM  Result Value Ref Range Status   Specimen Description BLOOD RIGHT HAND  Final   Special Requests BOTTLES DRAWN AEROBIC AND ANAEROBIC 5CC  Final   Culture NO GROWTH 5 DAYS  Final   Report Status 07/04/2016 FINAL  Final  Urine culture     Status: None   Collection Time: 06/30/16  7:39 AM  Result Value Ref Range Status   Specimen Description URINE, RANDOM  Final   Special Requests NONE  Final   Culture NO GROWTH  Final   Report Status 07/01/2016 FINAL  Final  Culture, blood (routine x 2)     Status: None (Preliminary result)   Collection Time: 07/04/16  2:58 PM  Result Value Ref Range Status  Specimen Description BLOOD LEFT HAND  Final   Special Requests BOTTLES DRAWN AEROBIC ONLY 10CC  Final   Culture NO GROWTH 4 DAYS  Final   Report Status PENDING  Incomplete  Culture, blood (routine x 2)     Status: None (Preliminary result)   Collection Time: 07/04/16  3:02 PM  Result Value Ref Range Status   Specimen Description BLOOD LEFT HAND  Final   Special Requests BOTTLES DRAWN AEROBIC ONLY 10CC  Final   Culture NO GROWTH 4 DAYS  Final   Report Status PENDING  Incomplete  Culture, respiratory (NON-Expectorated)     Status: None   Collection Time: 07/04/16  4:03 PM  Result Value Ref Range Status   Specimen Description TRACHEAL ASPIRATE  Final   Special Requests NONE  Final   Gram Stain   Final    ABUNDANT WBC PRESENT,  PREDOMINANTLY PMN RARE GRAM NEGATIVE RODS RARE YEAST    Culture FEW KLEBSIELLA OXYTOCA  Final   Report Status 07/06/2016 FINAL  Final   Organism ID, Bacteria KLEBSIELLA OXYTOCA  Final      Susceptibility   Klebsiella oxytoca - MIC*    AMPICILLIN >=32 RESISTANT Resistant     CEFAZOLIN <=4 SENSITIVE Sensitive     CEFEPIME <=1 SENSITIVE Sensitive     CEFTAZIDIME <=1 SENSITIVE Sensitive     CEFTRIAXONE <=1 SENSITIVE Sensitive     CIPROFLOXACIN <=0.25 SENSITIVE Sensitive     GENTAMICIN <=1 SENSITIVE Sensitive     IMIPENEM <=0.25 SENSITIVE Sensitive     TRIMETH/SULFA <=20 SENSITIVE Sensitive     AMPICILLIN/SULBACTAM 8 SENSITIVE Sensitive     PIP/TAZO <=4 SENSITIVE Sensitive     Extended ESBL NEGATIVE Sensitive     * FEW KLEBSIELLA OXYTOCA  Culture, Urine     Status: Abnormal   Collection Time: 07/04/16  5:55 PM  Result Value Ref Range Status   Specimen Description URINE, RANDOM  Final   Special Requests NONE  Final   Culture >=100,000 COLONIES/mL YEAST (A)  Final   Report Status 07/05/2016 FINAL  Final    Coagulation Studies: No results for input(s): LABPROT, INR in the last 72 hours.  Urinalysis: No results for input(s): COLORURINE, LABSPEC, PHURINE, GLUCOSEU, HGBUR, BILIRUBINUR, KETONESUR, PROTEINUR, UROBILINOGEN, NITRITE, LEUKOCYTESUR in the last 72 hours.  Invalid input(s): APPERANCEUR    Imaging: Dg Chest Port 1 View  Result Date: 07/08/2016 CLINICAL DATA:  71 year old male status post right below-the-knee amputation for nonhealing lower extremity wound with postoperative course complicated by respiratory failure and ARDS. Acute renal failure in need of dialysis. Initial encounter. EXAM: PORTABLE CHEST 1 VIEW COMPARISON:  07/06/2016 and earlier. FINDINGS: Portable AP semi upright view at 1433 hours. Stable endotracheal tube tip at the level the clavicles. Left PICC line remains looped over the medial left clavicle. Right IJ approach dual lumen dialysis type catheter now in  place. Faintly visible enteric tube at the level of the thoracic inlet. Stable cardiac size and mediastinal contours. Stable lung volumes. Continued widespread mostly interstitial pulmonary opacity. Superimposed more confluent biapical opacity. Superimposed dense retrocardiac opacity partially obscuring the diaphragm. Trace fluid or thickening along the right minor fissure. No other pleural effusion is evident. Overall pulmonary vascularity appears mildly increased and ventilation appears mildly decreased. IMPRESSION: 1. Right IJ dual lumen dialysis type catheter placed. Malpositioned left PICC line again noted. 2.  Otherwise stable lines and tubes. 3. Widespread bilateral pulmonary opacity with interval mildly worsened ventilation and progressed interstitial opacity favored due to pulmonary edema.  Electronically Signed   By: Genevie Ann M.D.   On: 07/08/2016 14:58     Medications:       Assessment/ Plan:  71 y.o.  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   1. Acute Renal failure / Solitary rt Pelvic Kidney 2. CKD st 3. Baseline Cr 1.27 (d/c from [facility]) Jun 29, 2016 3. B/L Pleural effusions and Acute respiratory failure requiring ventilator support 4. Anasarca/Generalized edema - Alb 1.5 - 3rd spacing of fluid  Plan:  Patient has completed 2 hemodialysis treatments. We will plan for his third dialysis treatment tomorrow. We will plan for daily dialysis for now.  As such we will prepare worse. Patient is continued on the ventilator. Hopefully ultrafiltration will aid in weaning him off the ventilator. We will continue to follow the patient closely.   LOS: 0 Anabeth Chilcott 1/24/20184:28 PM

## 2016-07-09 LAB — CULTURE, BLOOD (ROUTINE X 2)
CULTURE: NO GROWTH
Culture: NO GROWTH

## 2016-07-09 LAB — RENAL FUNCTION PANEL
Albumin: 1.6 g/dL — ABNORMAL LOW (ref 3.5–5.0)
Anion gap: 12 (ref 5–15)
BUN: 85 mg/dL — AB (ref 6–20)
CHLORIDE: 100 mmol/L — AB (ref 101–111)
CO2: 25 mmol/L (ref 22–32)
CREATININE: 3.39 mg/dL — AB (ref 0.61–1.24)
Calcium: 7.4 mg/dL — ABNORMAL LOW (ref 8.9–10.3)
GFR calc Af Amer: 20 mL/min — ABNORMAL LOW (ref >60)
GFR calc non Af Amer: 17 mL/min — ABNORMAL LOW (ref >60)
Glucose, Bld: 250 mg/dL — ABNORMAL HIGH (ref 65–99)
Phosphorus: 5.6 mg/dL — ABNORMAL HIGH (ref 2.5–4.6)
Potassium: 4.1 mmol/L (ref 3.5–5.1)
SODIUM: 137 mmol/L (ref 135–145)

## 2016-07-09 LAB — CBC
HCT: 25.4 % — ABNORMAL LOW (ref 39.0–52.0)
Hemoglobin: 8 g/dL — ABNORMAL LOW (ref 13.0–17.0)
MCH: 27.6 pg (ref 26.0–34.0)
MCHC: 31.5 g/dL (ref 30.0–36.0)
MCV: 87.6 fL (ref 78.0–100.0)
PLATELETS: 221 10*3/uL (ref 150–400)
RBC: 2.9 MIL/uL — ABNORMAL LOW (ref 4.22–5.81)
RDW: 15.7 % — AB (ref 11.5–15.5)
WBC: 15.7 10*3/uL — AB (ref 4.0–10.5)

## 2016-07-10 LAB — RENAL FUNCTION PANEL
ALBUMIN: 1.4 g/dL — AB (ref 3.5–5.0)
Anion gap: 9 (ref 5–15)
BUN: 69 mg/dL — AB (ref 6–20)
CALCIUM: 7.3 mg/dL — AB (ref 8.9–10.3)
CHLORIDE: 99 mmol/L — AB (ref 101–111)
CO2: 26 mmol/L (ref 22–32)
CREATININE: 3.01 mg/dL — AB (ref 0.61–1.24)
GFR, EST AFRICAN AMERICAN: 23 mL/min — AB (ref >60)
GFR, EST NON AFRICAN AMERICAN: 20 mL/min — AB (ref >60)
Glucose, Bld: 238 mg/dL — ABNORMAL HIGH (ref 65–99)
Phosphorus: 5.2 mg/dL — ABNORMAL HIGH (ref 2.5–4.6)
Potassium: 4 mmol/L (ref 3.5–5.1)
Sodium: 134 mmol/L — ABNORMAL LOW (ref 135–145)

## 2016-07-10 LAB — CBC
HCT: 21.8 % — ABNORMAL LOW (ref 39.0–52.0)
Hemoglobin: 6.9 g/dL — CL (ref 13.0–17.0)
MCH: 27.4 pg (ref 26.0–34.0)
MCHC: 31.7 g/dL (ref 30.0–36.0)
MCV: 86.5 fL (ref 78.0–100.0)
PLATELETS: 210 10*3/uL (ref 150–400)
RBC: 2.52 MIL/uL — AB (ref 4.22–5.81)
RDW: 15.2 % (ref 11.5–15.5)
WBC: 13.5 10*3/uL — AB (ref 4.0–10.5)

## 2016-07-10 LAB — PREPARE RBC (CROSSMATCH)

## 2016-07-10 LAB — BLOOD GAS, ARTERIAL
ACID-BASE EXCESS: 3.5 mmol/L — AB (ref 0.0–2.0)
BICARBONATE: 27.6 mmol/L (ref 20.0–28.0)
FIO2: 40
LHR: 24 {breaths}/min
O2 Saturation: 98 %
PCO2 ART: 48.5 mmHg — AB (ref 32.0–48.0)
PEEP: 8 cmH2O
PH ART: 7.387 (ref 7.350–7.450)
Patient temperature: 103.2
Pressure control: 24 cmH2O
pO2, Arterial: 118 mmHg — ABNORMAL HIGH (ref 83.0–108.0)

## 2016-07-10 LAB — ABO/RH: ABO/RH(D): O POS

## 2016-07-10 NOTE — Progress Notes (Signed)
[facility]  ROUNDING NOTE   Subjective:  Patient remains critically ill at this point in time. He is febrile the same. We plan for dialysis again today. Patient remains on the ventilator.   Objective:  Vital signs in last 24 hours:  Temperature 100.7 pulse 106 respirations 20 blood pressure 146/67  Physical Exam: General: Critically ill appearing  Head: ETT in place  Eyes: Anicteric  Neck: Supple, trachea midline  Lungs:  Bilateral rhonchi, vent assisted  Heart: S1S2 no rubs  Abdomen:  Soft, nontender, bowel sounds present  Extremities: Right BKA, 2+ b/l LE edema  Neurologic: Not following commands  Skin: No lesions  Access: R IJ temporary dialysis catheter    Basic Metabolic Panel:  Recent Labs Lab 07/03/16 1042  07/04/16 0500 07/05/16 0609 07/06/16 0740 07/07/16 0600 07/08/16 0722 07/09/16 0816 07/10/16 0700  NA 135  --  135 134* 137 139 137 137 134*  K 5.0  --  4.9 5.3* 4.2 4.4 4.5 4.1 4.0  CL 101  --  102 101 103 104 101 100* 99*  CO2 22  --  22 20* _0 GLUCOSE 388*  --  357* 402* 285* 232* 378* 250* 238*  BUN 76*  --  85* 100* 103* 105* 104* 85* 69*  CREATININE 2.76*  --  3.11* 3.53* 3.67* 3.63* 3.62* 3.39* 3.01*  CALCIUM 8.1*  --  7.9* 8.0* 8.0* 8.0* 7.7* 7.4* 7.3*  MG 2.0  --  2.0 2.0 2.1  --  2.0  --   --   PHOS  --   < > 5.0* 6.3* 5.8* 6.2* 5.1* 5.6* 5.2*  < > = values in this interval not displayed.  Liver Function Tests:  Recent Labs Lab 07/06/16 0740 07/07/16 0600 07/08/16 0722 07/09/16 0816 07/10/16 0700  ALBUMIN 1.4* 1.4* 1.5* 1.6* 1.4*   No results for input(s): LIPASE, AMYLASE in the last 168 hours. No results for input(s): AMMONIA in the last 168 hours.  CBC:  Recent Labs Lab 07/04/16 0500 07/05/16 0609 07/06/16 0740 07/07/16 0600 07/08/16 0722 07/09/16 0816 07/10/16 0700  WBC 18.0* 19.6* 18.7* 13.6* 14.3* 15.7* 13.5*  NEUTROABS 15.4* 17.0* 17.2*  --  12.1*  --   --   HGB 7.4* 7.6* 7.7* 7.5*  8.0* 8.0* 6.9*  HCT 23.3* 24.0* 24.3* 24.1* 25.5* 25.4* 21.8*  MCV 87.3 88.2 87.1 88.0 87.3 87.6 86.5  PLT 252 226 256 234 234 221 210    Cardiac Enzymes: No results for input(s): CKTOTAL, CKMB, CKMBINDEX, TROPONINI in the last 168 hours.  BNP: Invalid input(s): POCBNP  CBG: No results for input(s): GLUCAP in the last 168 hours.  Microbiology: Results for orders placed or performed during the hospital encounter of 06/29/16  Culture, blood (routine x 2)     Status: None   Collection Time: 06/29/16  6:12 PM  Result Value Ref Range Status   Specimen Description BLOOD LEFT HAND  Final   Special Requests IN PEDIATRIC BOTTLE [place]  Final   Culture NO GROWTH 5 DAYS  Final   Report Status 07/04/2016 FINAL  Final  Culture, blood (routine x 2)     Status: None   Collection Time: 06/29/16  6:23 PM  Result Value Ref Range Status   Specimen Description BLOOD RIGHT HAND  Final   Special Requests BOTTLES DRAWN AEROBIC AND ANAEROBIC 5CC  Final   Culture NO GROWTH 5 DAYS  Final   Report Status 07/04/2016 FINAL  Final  Urine  culture     Status: None   Collection Time: 06/30/16  7:39 AM  Result Value Ref Range Status   Specimen Description URINE, RANDOM  Final   Special Requests NONE  Final   Culture NO GROWTH  Final   Report Status 07/01/2016 FINAL  Final  Culture, blood (routine x 2)     Status: None   Collection Time: 07/04/16  2:58 PM  Result Value Ref Range Status   Specimen Description BLOOD LEFT HAND  Final   Special Requests BOTTLES DRAWN AEROBIC ONLY 10CC  Final   Culture NO GROWTH 5 DAYS  Final   Report Status 07/09/2016 FINAL  Final  Culture, blood (routine x 2)     Status: None   Collection Time: 07/04/16  3:02 PM  Result Value Ref Range Status   Specimen Description BLOOD LEFT HAND  Final   Special Requests BOTTLES DRAWN AEROBIC ONLY 10CC  Final   Culture NO GROWTH 5 DAYS  Final   Report Status 07/09/2016 FINAL  Final  Culture, respiratory (NON-Expectorated)     Status:  None   Collection Time: 07/04/16  4:03 PM  Result Value Ref Range Status   Specimen Description TRACHEAL ASPIRATE  Final   Special Requests NONE  Final   Gram Stain   Final    ABUNDANT WBC PRESENT, PREDOMINANTLY PMN RARE GRAM NEGATIVE RODS RARE YEAST    Culture FEW KLEBSIELLA OXYTOCA  Final   Report Status 07/06/2016 FINAL  Final   Organism ID, Bacteria KLEBSIELLA OXYTOCA  Final      Susceptibility   Klebsiella oxytoca - MIC*    AMPICILLIN >=32 RESISTANT Resistant     CEFAZOLIN <=4 SENSITIVE Sensitive     CEFEPIME <=1 SENSITIVE Sensitive     CEFTAZIDIME <=1 SENSITIVE Sensitive     CEFTRIAXONE <=1 SENSITIVE Sensitive     CIPROFLOXACIN <=0.25 SENSITIVE Sensitive     GENTAMICIN <=1 SENSITIVE Sensitive     IMIPENEM <=0.25 SENSITIVE Sensitive     TRIMETH/SULFA <=20 SENSITIVE Sensitive     AMPICILLIN/SULBACTAM 8 SENSITIVE Sensitive     PIP/TAZO <=4 SENSITIVE Sensitive     Extended ESBL NEGATIVE Sensitive     * FEW KLEBSIELLA OXYTOCA  Culture, Urine     Status: Abnormal   Collection Time: 07/04/16  5:55 PM  Result Value Ref Range Status   Specimen Description URINE, RANDOM  Final   Special Requests NONE  Final   Culture >=100,000 COLONIES/mL YEAST (A)  Final   Report Status 07/05/2016 FINAL  Final  Culture, respiratory (NON-Expectorated)     Status: None (Preliminary result)   Collection Time: 07/09/16  2:26 PM  Result Value Ref Range Status   Specimen Description TRACHEAL ASPIRATE  Final   Special Requests NONE  Final   Gram Stain   Final    MODERATE WBC PRESENT, PREDOMINANTLY PMN NO ORGANISMS SEEN    Culture PENDING  Incomplete   Report Status PENDING  Incomplete    Coagulation Studies: No results for input(s): LABPROT, INR in the last 72 hours.  Urinalysis: No results for input(s): COLORURINE, LABSPEC, PHURINE, GLUCOSEU, HGBUR, BILIRUBINUR, KETONESUR, PROTEINUR, UROBILINOGEN, NITRITE, LEUKOCYTESUR in the last 72 hours.  Invalid input(s): APPERANCEUR     Imaging: Dg Chest Port 1 View  Result Date: 07/08/2016 CLINICAL DATA:  PICC line. EXAM: PORTABLE CHEST 1 VIEW COMPARISON:  One-view chest x-ray from the same day. FINDINGS: The heart size is normal. Endotracheal tube is stable 4 cm above the carina. NG tube courses  off the inferior border the film. A right IJ catheter is stable. The left-sided PICC line has been manipulated and now terminates at the cavoatrial junction, in satisfactory position. Diffuse interstitial and airspace disease has slightly progressed, right greater than left. IMPRESSION: 1. Repositioning of left-sided PICC line, now in satisfactory position, just above the cavoatrial junction. 2. Support apparatus is otherwise stable. 3. Increasing interstitial and airspace disease compatible with edema or pneumonia. Electronically Signed   By: San Morelle M.D.   On: 07/08/2016 17:29   Dg Chest Port 1 View  Result Date: 07/08/2016 CLINICAL DATA:  71 year old male status post right below-the-knee amputation for nonhealing lower extremity wound with postoperative course complicated by respiratory failure and ARDS. Acute renal failure in need of dialysis. Initial encounter. EXAM: PORTABLE CHEST 1 VIEW COMPARISON:  07/06/2016 and earlier. FINDINGS: Portable AP semi upright view at 1433 hours. Stable endotracheal tube tip at the level the clavicles. Left PICC line remains looped over the medial left clavicle. Right IJ approach dual lumen dialysis type catheter now in place. Faintly visible enteric tube at the level of the thoracic inlet. Stable cardiac size and mediastinal contours. Stable lung volumes. Continued widespread mostly interstitial pulmonary opacity. Superimposed more confluent biapical opacity. Superimposed dense retrocardiac opacity partially obscuring the diaphragm. Trace fluid or thickening along the right minor fissure. No other pleural effusion is evident. Overall pulmonary vascularity appears mildly increased and  ventilation appears mildly decreased. IMPRESSION: 1. Right IJ dual lumen dialysis type catheter placed. Malpositioned left PICC line again noted. 2.  Otherwise stable lines and tubes. 3. Widespread bilateral pulmonary opacity with interval mildly worsened ventilation and progressed interstitial opacity favored due to pulmonary edema. Electronically Signed   By: Genevie Ann M.D.   On: 07/08/2016 14:58     Medications:       Assessment/ Plan:  71 y.o.  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   1. Acute Renal failure / Solitary rt Pelvic Kidney 2. CKD st 3. Baseline Cr 1.27 (d/c from [facility]) Jun 29, 2016 3. B/L Pleural effusions and Acute respiratory failure requiring ventilator support 4. Anasarca/Generalized edema - Alb 1.5 - 3rd spacing of fluid  5. Fever. 6. Anemia of CKD.   Plan:  Overall patient remains critically ill.  Azotemia has improved with the initiation of dialysis. BUN down to 69 with a creatinine of 3.0.  He remains on the ventilator. They have been requested by pulmonary/critical care to perform ultrafiltration with dialysis. We will plan for additional dialysis treatment today ultrafiltration target 1.5-2 kg. We will then reassess the patient for daily dialysis on Monday. Otherwise workup of fever per hospitalist.  Also defer consideration of blood transfusion to hospitalist as pt is febrile at the moment.    LOS: 0 Maximum Reiland 1/26/20188:52 AM

## 2016-07-11 LAB — RENAL FUNCTION PANEL
Albumin: 1.4 g/dL — ABNORMAL LOW (ref 3.5–5.0)
Anion gap: 11 (ref 5–15)
BUN: 98 mg/dL — ABNORMAL HIGH (ref 6–20)
CHLORIDE: 99 mmol/L — AB (ref 101–111)
CO2: 25 mmol/L (ref 22–32)
Calcium: 7.5 mg/dL — ABNORMAL LOW (ref 8.9–10.3)
Creatinine, Ser: 3.88 mg/dL — ABNORMAL HIGH (ref 0.61–1.24)
GFR calc non Af Amer: 14 mL/min — ABNORMAL LOW (ref >60)
GFR, EST AFRICAN AMERICAN: 17 mL/min — AB (ref >60)
Glucose, Bld: 169 mg/dL — ABNORMAL HIGH (ref 65–99)
POTASSIUM: 4.3 mmol/L (ref 3.5–5.1)
Phosphorus: 6.3 mg/dL — ABNORMAL HIGH (ref 2.5–4.6)
Sodium: 135 mmol/L (ref 135–145)

## 2016-07-11 LAB — CBC WITH DIFFERENTIAL/PLATELET
Basophils Absolute: 0 10*3/uL (ref 0.0–0.1)
Basophils Relative: 0 %
EOS PCT: 1 %
Eosinophils Absolute: 0.1 10*3/uL (ref 0.0–0.7)
HCT: 23.8 % — ABNORMAL LOW (ref 39.0–52.0)
Hemoglobin: 7.5 g/dL — ABNORMAL LOW (ref 13.0–17.0)
LYMPHS ABS: 0.8 10*3/uL (ref 0.7–4.0)
LYMPHS PCT: 5 %
MCH: 27.7 pg (ref 26.0–34.0)
MCHC: 31.5 g/dL (ref 30.0–36.0)
MCV: 87.8 fL (ref 78.0–100.0)
MONO ABS: 1 10*3/uL (ref 0.1–1.0)
Monocytes Relative: 6 %
Neutro Abs: 14.2 10*3/uL — ABNORMAL HIGH (ref 1.7–7.7)
Neutrophils Relative %: 88 %
PLATELETS: 247 10*3/uL (ref 150–400)
RBC: 2.71 MIL/uL — ABNORMAL LOW (ref 4.22–5.81)
RDW: 15.1 % (ref 11.5–15.5)
WBC: 16.1 10*3/uL — AB (ref 4.0–10.5)

## 2016-07-11 LAB — MAGNESIUM: Magnesium: 2.3 mg/dL (ref 1.7–2.4)

## 2016-07-11 LAB — PROCALCITONIN: Procalcitonin: 0.33 ng/mL

## 2016-07-13 ENCOUNTER — Other Ambulatory Visit (HOSPITAL_COMMUNITY): Payer: Federal, State, Local not specified - PPO

## 2016-07-13 LAB — CBC
HCT: 21.9 % — ABNORMAL LOW (ref 39.0–52.0)
HEMOGLOBIN: 7 g/dL — AB (ref 13.0–17.0)
MCH: 27.8 pg (ref 26.0–34.0)
MCHC: 32 g/dL (ref 30.0–36.0)
MCV: 86.9 fL (ref 78.0–100.0)
Platelets: 276 10*3/uL (ref 150–400)
RBC: 2.52 MIL/uL — AB (ref 4.22–5.81)
RDW: 15.3 % (ref 11.5–15.5)
WBC: 17.2 10*3/uL — AB (ref 4.0–10.5)

## 2016-07-13 LAB — RENAL FUNCTION PANEL
ANION GAP: 12 (ref 5–15)
Albumin: 1.4 g/dL — ABNORMAL LOW (ref 3.5–5.0)
BUN: 131 mg/dL — ABNORMAL HIGH (ref 6–20)
CALCIUM: 7.6 mg/dL — AB (ref 8.9–10.3)
CHLORIDE: 100 mmol/L — AB (ref 101–111)
CO2: 24 mmol/L (ref 22–32)
Creatinine, Ser: 4.7 mg/dL — ABNORMAL HIGH (ref 0.61–1.24)
GFR calc Af Amer: 13 mL/min — ABNORMAL LOW (ref >60)
GFR calc non Af Amer: 11 mL/min — ABNORMAL LOW (ref >60)
Glucose, Bld: 125 mg/dL — ABNORMAL HIGH (ref 65–99)
Phosphorus: 6.4 mg/dL — ABNORMAL HIGH (ref 2.5–4.6)
Potassium: 3.6 mmol/L (ref 3.5–5.1)
SODIUM: 136 mmol/L (ref 135–145)

## 2016-07-13 LAB — CULTURE, RESPIRATORY W GRAM STAIN

## 2016-07-13 LAB — CULTURE, RESPIRATORY

## 2016-07-13 NOTE — Progress Notes (Signed)
[facility]  ROUNDING NOTE   Subjective:  Patient remains critically ill at this point in time. Patient remains on the ventilator. 40/5 Foley bag has UOP > 500 cc TF @ 30 cc/hr    Objective:  Vital signs in last 24 hours:  Temperature 98.6 pulse 93 respirations 20 blood pressure 129/59  Physical Exam: General: Critically ill appearing  Head: ETT in place  Eyes: Anicteric  Neck: Supple,  Lungs:  Bilateral rhonchi, vent assisted  Heart: S1S2 no rubs, irregular  Abdomen:  Soft, nontender,   Extremities: Right BKA,  2+ b/l LE edema  Neurologic: Not following commands  Skin: No lesions  Access: R IJ temporary dialysis catheter    Basic Metabolic Panel:  Recent Labs Lab 07/08/16 0722 07/09/16 0816 07/10/16 0700 07/11/16 0646 07/11/16 0652 07/13/16 0608  NA 137 137 134*  --  135 136  K 4.5 4.1 4.0  --  4.3 3.6  CL 101 100* 99*  --  99* 100*  CO2 23 25 26   --  25 24  GLUCOSE 378* 250* 238*  --  169* 125*  BUN 104* 85* 69*  --  98* 131*  CREATININE 3.62* 3.39* 3.01*  --  3.88* 4.70*  CALCIUM 7.7* 7.4* 7.3*  --  7.5* 7.6*  MG 2.0  --   --  2.3  --   --   PHOS 5.1* 5.6* 5.2*  --  6.3* 6.4*    Liver Function Tests:  Recent Labs Lab 07/08/16 0722 07/09/16 0816 07/10/16 0700 07/11/16 0652 07/13/16 0608  ALBUMIN 1.5* 1.6* 1.4* 1.4* 1.4*   No results for input(s): LIPASE, AMYLASE in the last 168 hours. No results for input(s): AMMONIA in the last 168 hours.  CBC:  Recent Labs Lab 07/08/16 0722 07/09/16 0816 07/10/16 0700 07/11/16 0646 07/13/16 0608  WBC 14.3* 15.7* 13.5* 16.1* 17.2*  NEUTROABS 12.1*  --   --  14.2*  --   HGB 8.0* 8.0* 6.9* 7.5* 7.0*  HCT 25.5* 25.4* 21.8* 23.8* 21.9*  MCV 87.3 87.6 86.5 87.8 86.9  PLT 234 221 210 247 276    Cardiac Enzymes: No results for input(s): CKTOTAL, CKMB, CKMBINDEX, TROPONINI in the last 168 hours.  BNP: Invalid input(s): POCBNP  CBG: No results for input(s): GLUCAP in the last 168  hours.  Microbiology: Results for orders placed or performed during the hospital encounter of 06/29/16  Culture, blood (routine x 2)     Status: None   Collection Time: 06/29/16  6:12 PM  Result Value Ref Range Status   Specimen Description BLOOD LEFT HAND  Final   Special Requests IN PEDIATRIC BOTTLE [place]  Final   Culture NO GROWTH 5 DAYS  Final   Report Status 07/04/2016 FINAL  Final  Culture, blood (routine x 2)     Status: None   Collection Time: 06/29/16  6:23 PM  Result Value Ref Range Status   Specimen Description BLOOD RIGHT HAND  Final   Special Requests BOTTLES DRAWN AEROBIC AND ANAEROBIC 5CC  Final   Culture NO GROWTH 5 DAYS  Final   Report Status 07/04/2016 FINAL  Final  Urine culture     Status: None   Collection Time: 06/30/16  7:39 AM  Result Value Ref Range Status   Specimen Description URINE, RANDOM  Final   Special Requests NONE  Final   Culture NO GROWTH  Final   Report Status 07/01/2016 FINAL  Final  Culture, blood (routine x 2)  Status: None   Collection Time: 07/04/16  2:58 PM  Result Value Ref Range Status   Specimen Description BLOOD LEFT HAND  Final   Special Requests BOTTLES DRAWN AEROBIC ONLY 10CC  Final   Culture NO GROWTH 5 DAYS  Final   Report Status 07/09/2016 FINAL  Final  Culture, blood (routine x 2)     Status: None   Collection Time: 07/04/16  3:02 PM  Result Value Ref Range Status   Specimen Description BLOOD LEFT HAND  Final   Special Requests BOTTLES DRAWN AEROBIC ONLY 10CC  Final   Culture NO GROWTH 5 DAYS  Final   Report Status 07/09/2016 FINAL  Final  Culture, respiratory (NON-Expectorated)     Status: None   Collection Time: 07/04/16  4:03 PM  Result Value Ref Range Status   Specimen Description TRACHEAL ASPIRATE  Final   Special Requests NONE  Final   Gram Stain   Final    ABUNDANT WBC PRESENT, PREDOMINANTLY PMN RARE GRAM NEGATIVE RODS RARE YEAST    Culture FEW KLEBSIELLA OXYTOCA  Final   Report Status 07/06/2016 FINAL   Final   Organism ID, Bacteria KLEBSIELLA OXYTOCA  Final      Susceptibility   Klebsiella oxytoca - MIC*    AMPICILLIN >=32 RESISTANT Resistant     CEFAZOLIN <=4 SENSITIVE Sensitive     CEFEPIME <=1 SENSITIVE Sensitive     CEFTAZIDIME <=1 SENSITIVE Sensitive     CEFTRIAXONE <=1 SENSITIVE Sensitive     CIPROFLOXACIN <=0.25 SENSITIVE Sensitive     GENTAMICIN <=1 SENSITIVE Sensitive     IMIPENEM <=0.25 SENSITIVE Sensitive     TRIMETH/SULFA <=20 SENSITIVE Sensitive     AMPICILLIN/SULBACTAM 8 SENSITIVE Sensitive     PIP/TAZO <=4 SENSITIVE Sensitive     Extended ESBL NEGATIVE Sensitive     * FEW KLEBSIELLA OXYTOCA  Culture, Urine     Status: Abnormal   Collection Time: 07/04/16  5:55 PM  Result Value Ref Range Status   Specimen Description URINE, RANDOM  Final   Special Requests NONE  Final   Culture >=100,000 COLONIES/mL YEAST (A)  Final   Report Status 07/05/2016 FINAL  Final  Culture, respiratory (NON-Expectorated)     Status: None   Collection Time: 07/09/16  2:26 PM  Result Value Ref Range Status   Specimen Description TRACHEAL ASPIRATE  Final   Special Requests NONE  Final   Gram Stain   Final    MODERATE WBC PRESENT, PREDOMINANTLY PMN NO ORGANISMS SEEN    Culture   Final    ABUNDANT PSEUDOMONAS AERUGINOSA MODERATE KLEBSIELLA OXYTOCA    Report Status 07/13/2016 FINAL  Final   Organism ID, Bacteria PSEUDOMONAS AERUGINOSA  Final   Organism ID, Bacteria KLEBSIELLA OXYTOCA  Final      Susceptibility   Klebsiella oxytoca - MIC*    AMPICILLIN >=32 RESISTANT Resistant     CEFAZOLIN <=4 SENSITIVE Sensitive     CEFEPIME <=1 SENSITIVE Sensitive     CEFTAZIDIME <=1 SENSITIVE Sensitive     CEFTRIAXONE <=1 SENSITIVE Sensitive     CIPROFLOXACIN <=0.25 SENSITIVE Sensitive     GENTAMICIN <=1 SENSITIVE Sensitive     IMIPENEM <=0.25 SENSITIVE Sensitive     TRIMETH/SULFA <=20 SENSITIVE Sensitive     AMPICILLIN/SULBACTAM 8 SENSITIVE Sensitive     PIP/TAZO <=4 SENSITIVE Sensitive      Extended ESBL NEGATIVE Sensitive     * MODERATE KLEBSIELLA OXYTOCA   Pseudomonas aeruginosa - MIC*    CEFTAZIDIME 4  SENSITIVE Sensitive     CIPROFLOXACIN 0.5 SENSITIVE Sensitive     GENTAMICIN <=1 SENSITIVE Sensitive     IMIPENEM 2 SENSITIVE Sensitive     PIP/TAZO 16 SENSITIVE Sensitive     CEFEPIME 4 SENSITIVE Sensitive     * ABUNDANT PSEUDOMONAS AERUGINOSA    Coagulation Studies: No results for input(s): LABPROT, INR in the last 72 hours.  Urinalysis: No results for input(s): COLORURINE, LABSPEC, PHURINE, GLUCOSEU, HGBUR, BILIRUBINUR, KETONESUR, PROTEINUR, UROBILINOGEN, NITRITE, LEUKOCYTESUR in the last 72 hours.  Invalid input(s): APPERANCEUR    Imaging: Dg Chest Port 1 View  Result Date: 07/13/2016 CLINICAL DATA:  71 year old male with endotracheal tube in place. Subsequent encounter. EXAM: PORTABLE CHEST 1 VIEW COMPARISON:  07/08/2016. FINDINGS: Endotracheal tube tip 7.5 cm above the carina. Right central line catheter tip proximal right atrium level. Left PICC line has changed position with the tip now directed superiorly entering what appears to be the lower aspect of the right internal jugular vein. Diffuse airspace disease similar to prior exam. This may represent pulmonary edema however, underlying infiltrate or mass not excluded. Cardiomegaly. Tortuous calcified aorta. IMPRESSION: Left PICC line has changed position with the tip now directed superiorly entering what appears to be the lower aspect of the right internal jugular vein. Remainder of findings are similar to prior exam.  Please see above. These results will be called to the ordering clinician or representative by the Radiologist Assistant, and communication documented in the PACS or zVision Dashboard. Electronically Signed   By: Genia Del M.D.   On: 07/13/2016 12:29     Medications:       Assessment/ Plan:  71 y.o.  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   1. Acute Renal failure / Solitary rt Pelvic Kidney 2. CKD st 3. Baseline Cr 1.27 (d/c from [facility]) Jun 29, 2016 3. B/L Pleural effusions and Acute respiratory failure requiring ventilator support 4. Anasarca/Generalized edema - Low albumin  - 3rd spacing of fluid  5. Fever. 6. Anemia of CKD.   Plan:  Overall patient remains critically ill. He remains on the ventilator.  BUN  High again at 131 Albumin low at 1.4 UF 1.5 ordered with HD today Will decrease dose of hydralazine to allow BP to be high for aggressive fluid removal with HD   LOS: 0 Taylor Maldonado 1/29/20182:48 PM

## 2016-07-14 ENCOUNTER — Other Ambulatory Visit (HOSPITAL_COMMUNITY): Payer: Federal, State, Local not specified - PPO

## 2016-07-14 LAB — CBC
HCT: 14.2 % — ABNORMAL LOW (ref 39.0–52.0)
HEMATOCRIT: 22.7 % — AB (ref 39.0–52.0)
Hemoglobin: 4.5 g/dL — CL (ref 13.0–17.0)
Hemoglobin: 7.3 g/dL — ABNORMAL LOW (ref 13.0–17.0)
MCH: 27.6 pg (ref 26.0–34.0)
MCH: 28.3 pg (ref 26.0–34.0)
MCHC: 31.7 g/dL (ref 30.0–36.0)
MCHC: 32.2 g/dL (ref 30.0–36.0)
MCV: 87.1 fL (ref 78.0–100.0)
MCV: 88 fL (ref 78.0–100.0)
PLATELETS: 173 10*3/uL (ref 150–400)
PLATELETS: 304 10*3/uL (ref 150–400)
RBC: 1.63 MIL/uL — AB (ref 4.22–5.81)
RBC: 2.58 MIL/uL — ABNORMAL LOW (ref 4.22–5.81)
RDW: 15.4 % (ref 11.5–15.5)
RDW: 15.8 % — AB (ref 11.5–15.5)
WBC: 11.5 10*3/uL — ABNORMAL HIGH (ref 4.0–10.5)
WBC: 15.4 10*3/uL — ABNORMAL HIGH (ref 4.0–10.5)

## 2016-07-14 LAB — TYPE AND SCREEN
ABO/RH(D): O POS
ANTIBODY SCREEN: NEGATIVE
UNIT DIVISION: 0
Unit division: 0

## 2016-07-15 ENCOUNTER — Other Ambulatory Visit (HOSPITAL_COMMUNITY): Payer: Federal, State, Local not specified - PPO

## 2016-07-15 ENCOUNTER — Other Ambulatory Visit (HOSPITAL_BASED_OUTPATIENT_CLINIC_OR_DEPARTMENT_OTHER): Payer: Federal, State, Local not specified - PPO

## 2016-07-15 DIAGNOSIS — I509 Heart failure, unspecified: Secondary | ICD-10-CM

## 2016-07-15 LAB — CBC
HCT: 23.3 % — ABNORMAL LOW (ref 39.0–52.0)
Hemoglobin: 7.3 g/dL — ABNORMAL LOW (ref 13.0–17.0)
MCH: 27.2 pg (ref 26.0–34.0)
MCHC: 31.3 g/dL (ref 30.0–36.0)
MCV: 86.9 fL (ref 78.0–100.0)
Platelets: 270 10*3/uL (ref 150–400)
RBC: 2.68 MIL/uL — ABNORMAL LOW (ref 4.22–5.81)
RDW: 15.4 % (ref 11.5–15.5)
WBC: 9.6 10*3/uL (ref 4.0–10.5)

## 2016-07-15 LAB — RENAL FUNCTION PANEL
Albumin: 1.4 g/dL — ABNORMAL LOW (ref 3.5–5.0)
Anion gap: 13 (ref 5–15)
BUN: 100 mg/dL — ABNORMAL HIGH (ref 6–20)
CHLORIDE: 99 mmol/L — AB (ref 101–111)
CO2: 22 mmol/L (ref 22–32)
Calcium: 7.7 mg/dL — ABNORMAL LOW (ref 8.9–10.3)
Creatinine, Ser: 4.6 mg/dL — ABNORMAL HIGH (ref 0.61–1.24)
GFR calc Af Amer: 14 mL/min — ABNORMAL LOW (ref >60)
GFR calc non Af Amer: 12 mL/min — ABNORMAL LOW (ref >60)
Glucose, Bld: 214 mg/dL — ABNORMAL HIGH (ref 65–99)
POTASSIUM: 3.5 mmol/L (ref 3.5–5.1)
Phosphorus: 5.4 mg/dL — ABNORMAL HIGH (ref 2.5–4.6)
Sodium: 134 mmol/L — ABNORMAL LOW (ref 135–145)

## 2016-07-15 LAB — PROTIME-INR
INR: 1.52
PROTHROMBIN TIME: 18.4 s — AB (ref 11.4–15.2)
Prothrombin Time: >90 seconds — ABNORMAL HIGH (ref 11.4–15.2)

## 2016-07-15 NOTE — Progress Notes (Signed)
[facility]  ROUNDING NOTE   Subjective:  Patient remains critically ill at this point in time.  His wife is at bedside 2500 cc of fluid was removed with HD on monday Patient remains on the ventilator. 35/5 Foley bag has minimal UOP TF @ 30 cc/hr Concern over rt leg wound necrosis     Objective:  Vital signs in last 24 hours:  Temperature 97.2 pulse 63 respirations 24 blood pressure 95/52  Physical Exam: General: Critically ill appearing  Head: ETT in place  Eyes: Anicteric  Neck: Supple,  Lungs:  Bilateral rhonchi, vent assisted  Heart: S1S2 no rubs, irregular  Abdomen:  Soft, nontender,   Extremities: Right BKA,  Wound necrosis 2+ dependent edema, Left foot dependent pressure ulcer  Neurologic: Eyes open, tracking  Skin: No acute rashes  Access: R IJ temporary dialysis catheter    Basic Metabolic Panel:  Recent Labs Lab 07/09/16 0816 07/10/16 0700 07/11/16 0646 07/11/16 0652 07/13/16 0608  NA 137 134*  --  135 136  K 4.1 4.0  --  4.3 3.6  CL 100* 99*  --  99* 100*  CO2 25 26  --  25 24  GLUCOSE 250* 238*  --  169* 125*  BUN 85* 69*  --  98* 131*  CREATININE 3.39* 3.01*  --  3.88* 4.70*  CALCIUM 7.4* 7.3*  --  7.5* 7.6*  MG  --   --  2.3  --   --   PHOS 5.6* 5.2*  --  6.3* 6.4*    Liver Function Tests:  Recent Labs Lab 07/09/16 0816 07/10/16 0700 07/11/16 0652 07/13/16 0608  ALBUMIN 1.6* 1.4* 1.4* 1.4*   No results for input(s): LIPASE, AMYLASE in the last 168 hours. No results for input(s): AMMONIA in the last 168 hours.  CBC:  Recent Labs Lab 07/11/16 0646 07/13/16 0608 07/14/16 1200 07/14/16 1656 07/15/16 1528  WBC 16.1* 17.2* 11.5* 15.4* 9.6  NEUTROABS 14.2*  --   --   --   --   HGB 7.5* 7.0* 4.5* 7.3* 7.3*  HCT 23.8* 21.9* 14.2* 22.7* 23.3*  MCV 87.8 86.9 87.1 88.0 86.9  PLT 247 276 173 304 270    Cardiac Enzymes: No results for input(s): CKTOTAL, CKMB, CKMBINDEX, TROPONINI in the last 168 hours.  BNP: Invalid  input(s): POCBNP  CBG: No results for input(s): GLUCAP in the last 168 hours.  Microbiology: Results for orders placed or performed during the hospital encounter of 06/29/16  Culture, blood (routine x 2)     Status: None   Collection Time: 06/29/16  6:12 PM  Result Value Ref Range Status   Specimen Description BLOOD LEFT HAND  Final   Special Requests IN PEDIATRIC BOTTLE [place]  Final   Culture NO GROWTH 5 DAYS  Final   Report Status 07/04/2016 FINAL  Final  Culture, blood (routine x 2)     Status: None   Collection Time: 06/29/16  6:23 PM  Result Value Ref Range Status   Specimen Description BLOOD RIGHT HAND  Final   Special Requests BOTTLES DRAWN AEROBIC AND ANAEROBIC 5CC  Final   Culture NO GROWTH 5 DAYS  Final   Report Status 07/04/2016 FINAL  Final  Urine culture     Status: None   Collection Time: 06/30/16  7:39 AM  Result Value Ref Range Status   Specimen Description URINE, RANDOM  Final   Special Requests NONE  Final   Culture NO GROWTH  Final   Report  Status 07/01/2016 FINAL  Final  Culture, blood (routine x 2)     Status: None   Collection Time: 07/04/16  2:58 PM  Result Value Ref Range Status   Specimen Description BLOOD LEFT HAND  Final   Special Requests BOTTLES DRAWN AEROBIC ONLY 10CC  Final   Culture NO GROWTH 5 DAYS  Final   Report Status 07/09/2016 FINAL  Final  Culture, blood (routine x 2)     Status: None   Collection Time: 07/04/16  3:02 PM  Result Value Ref Range Status   Specimen Description BLOOD LEFT HAND  Final   Special Requests BOTTLES DRAWN AEROBIC ONLY 10CC  Final   Culture NO GROWTH 5 DAYS  Final   Report Status 07/09/2016 FINAL  Final  Culture, respiratory (NON-Expectorated)     Status: None   Collection Time: 07/04/16  4:03 PM  Result Value Ref Range Status   Specimen Description TRACHEAL ASPIRATE  Final   Special Requests NONE  Final   Gram Stain   Final    ABUNDANT WBC PRESENT, PREDOMINANTLY PMN RARE GRAM NEGATIVE RODS RARE YEAST     Culture FEW KLEBSIELLA OXYTOCA  Final   Report Status 07/06/2016 FINAL  Final   Organism ID, Bacteria KLEBSIELLA OXYTOCA  Final      Susceptibility   Klebsiella oxytoca - MIC*    AMPICILLIN >=32 RESISTANT Resistant     CEFAZOLIN <=4 SENSITIVE Sensitive     CEFEPIME <=1 SENSITIVE Sensitive     CEFTAZIDIME <=1 SENSITIVE Sensitive     CEFTRIAXONE <=1 SENSITIVE Sensitive     CIPROFLOXACIN <=0.25 SENSITIVE Sensitive     GENTAMICIN <=1 SENSITIVE Sensitive     IMIPENEM <=0.25 SENSITIVE Sensitive     TRIMETH/SULFA <=20 SENSITIVE Sensitive     AMPICILLIN/SULBACTAM 8 SENSITIVE Sensitive     PIP/TAZO <=4 SENSITIVE Sensitive     Extended ESBL NEGATIVE Sensitive     * FEW KLEBSIELLA OXYTOCA  Culture, Urine     Status: Abnormal   Collection Time: 07/04/16  5:55 PM  Result Value Ref Range Status   Specimen Description URINE, RANDOM  Final   Special Requests NONE  Final   Culture >=100,000 COLONIES/mL YEAST (A)  Final   Report Status 07/05/2016 FINAL  Final  Culture, respiratory (NON-Expectorated)     Status: None   Collection Time: 07/09/16  2:26 PM  Result Value Ref Range Status   Specimen Description TRACHEAL ASPIRATE  Final   Special Requests NONE  Final   Gram Stain   Final    MODERATE WBC PRESENT, PREDOMINANTLY PMN NO ORGANISMS SEEN    Culture   Final    ABUNDANT PSEUDOMONAS AERUGINOSA MODERATE KLEBSIELLA OXYTOCA    Report Status 07/13/2016 FINAL  Final   Organism ID, Bacteria PSEUDOMONAS AERUGINOSA  Final   Organism ID, Bacteria KLEBSIELLA OXYTOCA  Final      Susceptibility   Klebsiella oxytoca - MIC*    AMPICILLIN >=32 RESISTANT Resistant     CEFAZOLIN <=4 SENSITIVE Sensitive     CEFEPIME <=1 SENSITIVE Sensitive     CEFTAZIDIME <=1 SENSITIVE Sensitive     CEFTRIAXONE <=1 SENSITIVE Sensitive     CIPROFLOXACIN <=0.25 SENSITIVE Sensitive     GENTAMICIN <=1 SENSITIVE Sensitive     IMIPENEM <=0.25 SENSITIVE Sensitive     TRIMETH/SULFA <=20 SENSITIVE Sensitive      AMPICILLIN/SULBACTAM 8 SENSITIVE Sensitive     PIP/TAZO <=4 SENSITIVE Sensitive     Extended ESBL NEGATIVE Sensitive     *  MODERATE KLEBSIELLA OXYTOCA   Pseudomonas aeruginosa - MIC*    CEFTAZIDIME 4 SENSITIVE Sensitive     CIPROFLOXACIN 0.5 SENSITIVE Sensitive     GENTAMICIN <=1 SENSITIVE Sensitive     IMIPENEM 2 SENSITIVE Sensitive     PIP/TAZO 16 SENSITIVE Sensitive     CEFEPIME 4 SENSITIVE Sensitive     * ABUNDANT PSEUDOMONAS AERUGINOSA    Coagulation Studies: No results for input(s): LABPROT, INR in the last 72 hours.  Urinalysis: No results for input(s): COLORURINE, LABSPEC, PHURINE, GLUCOSEU, HGBUR, BILIRUBINUR, KETONESUR, PROTEINUR, UROBILINOGEN, NITRITE, LEUKOCYTESUR in the last 72 hours.  Invalid input(s): APPERANCEUR    Imaging: Dg Chest Port 1 View  Result Date: 07/14/2016 CLINICAL DATA:  71 year old with PICC line exchange. Subsequent encounter. EXAM: PORTABLE CHEST 1 VIEW COMPARISON:  07/13/2016. FINDINGS: Left PICC line has been changed with new left PICC line tip at the level of the proximal to mid superior vena cava. Endotracheal tube tip 4 cm above the carina. Right central line tip distal superior vena cava near the cavoatrial junction. Diffuse airspace disease with slight worsening from the prior exam which may represent pulmonary edema superimposed upon chronic changes. Limited for excluding underlying infectious infiltrate or mass. No gross pneumothorax. Limited evaluation mediastinal and cardiac silhouette without change. IMPRESSION: Left PICC line tip proximal to mid superior vena cava level. Slight worsening of diffuse airspace disease as noted above. Electronically Signed   By: Genia Del M.D.   On: 07/14/2016 11:31   Dg Abd Portable 1v  Result Date: 07/15/2016 CLINICAL DATA:  NG tube placement EXAM: PORTABLE ABDOMEN - 1 VIEW COMPARISON:  07/15/2016 FINDINGS: Stable pleuroparenchymal opacity left lung base. Postcholecystectomy surgical clips are noted. There  is NG tube in place with tip in proximal stomach the IMPRESSION: NG tube in place with tip in proximal stomach. Electronically Signed   By: Lahoma Crocker M.D.   On: 07/15/2016 10:47   Dg Abd Portable 1v  Result Date: 07/15/2016 CLINICAL DATA:  NG tube placement EXAM: PORTABLE ABDOMEN - 1 VIEW COMPARISON:  None. FINDINGS: The NG tube is coiling within the mid distal esophagus with the tip extending cephalad. The tube does not extend below the hemidiaphragm. Pleuroparenchymal opacity in the left lung base is stable. IMPRESSION: NG tube coils in the mid distal esophagus with the tip re- entering the more proximal esophagus. Electronically Signed   By: Ivar Drape M.D.   On: 07/15/2016 08:41     Medications:       Assessment/ Plan:  71 y.o.  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   1. Acute Renal failure / Solitary rt Pelvic Kidney 2. CKD st 3. Baseline Cr 1.27 (d/c from [facility]) Jun 29, 2016 3. B/L Pleural effusions and Acute respiratory failure requiring ventilator support 4. Anasarca/Generalized edema - Low albumin  - 3rd spacing of fluid  5. Fever. 6. Anemia of CKD.   Plan:  Overall patient remains critically ill. He remains on the ventilator.  BUN  High again at 100 (reduced from 131 prior to dialysis) Albumin low at 1.4 UF with HD today as tolerated. 3-4 L ordered along with iv albumin but UF will be limited due to hypotension Will d/c clonidine  and hydralazine to allow BP to be high for aggressive fluid removal with HD   LOS: 0 Maeson Lourenco 1/31/20183:52 PM

## 2016-07-15 NOTE — Progress Notes (Signed)
  Echocardiogram 2D Echocardiogram has been performed.  Tye SavoyCasey N Loic Hobin 07/15/2016, 4:08 PM

## 2016-07-17 LAB — RENAL FUNCTION PANEL
ALBUMIN: 1.4 g/dL — AB (ref 3.5–5.0)
ANION GAP: 10 (ref 5–15)
BUN: 73 mg/dL — ABNORMAL HIGH (ref 6–20)
CHLORIDE: 101 mmol/L (ref 101–111)
CO2: 25 mmol/L (ref 22–32)
Calcium: 7.7 mg/dL — ABNORMAL LOW (ref 8.9–10.3)
Creatinine, Ser: 4.68 mg/dL — ABNORMAL HIGH (ref 0.61–1.24)
GFR calc non Af Amer: 11 mL/min — ABNORMAL LOW (ref >60)
GFR, EST AFRICAN AMERICAN: 13 mL/min — AB (ref >60)
Glucose, Bld: 151 mg/dL — ABNORMAL HIGH (ref 65–99)
PHOSPHORUS: 4.9 mg/dL — AB (ref 2.5–4.6)
POTASSIUM: 3.3 mmol/L — AB (ref 3.5–5.1)
Sodium: 136 mmol/L (ref 135–145)

## 2016-07-17 LAB — CBC
HCT: 20.6 % — ABNORMAL LOW (ref 39.0–52.0)
HEMOGLOBIN: 6.7 g/dL — AB (ref 13.0–17.0)
MCH: 28.6 pg (ref 26.0–34.0)
MCHC: 32.5 g/dL (ref 30.0–36.0)
MCV: 88 fL (ref 78.0–100.0)
Platelets: 307 10*3/uL (ref 150–400)
RBC: 2.34 MIL/uL — AB (ref 4.22–5.81)
RDW: 15.8 % — ABNORMAL HIGH (ref 11.5–15.5)
WBC: 8.3 10*3/uL (ref 4.0–10.5)

## 2016-07-17 LAB — PREPARE RBC (CROSSMATCH)

## 2016-07-17 NOTE — Progress Notes (Signed)
[facility]  ROUNDING NOTE   Subjective:  Hemodialysis this morning. Tolerated treatment well. UF of 2.9 litres. GIven with IV albumin.   Wife at bedside.   PRBC transfusion today   Objective:  Vital signs in last 24 hours:  Temperature 97 pulse 84 respirations 24 blood pressure 107/60  Physical Exam: General: Critically ill appearing  Head: ETT    Eyes: Anicteric  Neck: Supple,  Lungs:  Bilateral rhonchi, vent assisted  Heart: S1S2 no rubs, irregular  Abdomen:  Soft, nontender,   Extremities: Right BKA,  Wound necrosis 2+ dependent edema, Left foot dependent pressure ulcer  Neurologic: Eyes open, tracking  Skin: No acute rashes  Access: R IJ temporary dialysis catheter    Basic Metabolic Panel:  Recent Labs Lab 07/11/16 0646  07/11/16 0652 07/13/16 0608 07/15/16 1528 07/17/16 0649  NA  --   --  135 136 134* 136  K  --   --  4.3 3.6 3.5 3.3*  CL  --   --  99* 100* 99* 101  CO2  --   --  _0 GLUCOSE  --   --  169* 125* 214* 151*  BUN  --   --  98* 131* 100* 73*  CREATININE  --   --  3.88* 4.70* 4.60* 4.68*  CALCIUM  --   < > 7.5* 7.6* 7.7* 7.7*  MG 2.3  --   --   --   --   --   PHOS  --   --  6.3* 6.4* 5.4* 4.9*  < > = values in this interval not displayed.  Liver Function Tests:  Recent Labs Lab 07/11/16 0652 07/13/16 0608 07/15/16 1528 07/17/16 0649  ALBUMIN 1.4* 1.4* 1.4* 1.4*   No results for input(s): LIPASE, AMYLASE in the last 168 hours. No results for input(s): AMMONIA in the last 168 hours.  CBC:  Recent Labs Lab 07/11/16 0646 07/13/16 0608 07/14/16 1200 07/14/16 1656 07/15/16 1528 07/17/16 0647  WBC 16.1* 17.2* 11.5* 15.4* 9.6 8.3  NEUTROABS 14.2*  --   --   --   --   --   HGB 7.5* 7.0* 4.5* 7.3* 7.3* 6.7*  HCT 23.8* 21.9* 14.2* 22.7* 23.3* 20.6*  MCV 87.8 86.9 87.1 88.0 86.9 88.0  PLT 247 276 173 304 270 307    Cardiac Enzymes: No results for input(s): CKTOTAL, CKMB, CKMBINDEX, TROPONINI in the last 168  hours.  BNP: Invalid input(s): POCBNP  CBG: No results for input(s): GLUCAP in the last 168 hours.  Microbiology: Results for orders placed or performed during the hospital encounter of 06/29/16  Culture, blood (routine x 2)     Status: None   Collection Time: 06/29/16  6:12 PM  Result Value Ref Range Status   Specimen Description BLOOD LEFT HAND  Final   Special Requests IN PEDIATRIC BOTTLE [place]  Final   Culture NO GROWTH 5 DAYS  Final   Report Status 07/04/2016 FINAL  Final  Culture, blood (routine x 2)     Status: None   Collection Time: 06/29/16  6:23 PM  Result Value Ref Range Status   Specimen Description BLOOD RIGHT HAND  Final   Special Requests BOTTLES DRAWN AEROBIC AND ANAEROBIC 5CC  Final   Culture NO GROWTH 5 DAYS  Final   Report Status 07/04/2016 FINAL  Final  Urine culture     Status: None   Collection Time: 06/30/16  7:39 AM  Result Value Ref Range Status  Specimen Description URINE, RANDOM  Final   Special Requests NONE  Final   Culture NO GROWTH  Final   Report Status 07/01/2016 FINAL  Final  Culture, blood (routine x 2)     Status: None   Collection Time: 07/04/16  2:58 PM  Result Value Ref Range Status   Specimen Description BLOOD LEFT HAND  Final   Special Requests BOTTLES DRAWN AEROBIC ONLY 10CC  Final   Culture NO GROWTH 5 DAYS  Final   Report Status 07/09/2016 FINAL  Final  Culture, blood (routine x 2)     Status: None   Collection Time: 07/04/16  3:02 PM  Result Value Ref Range Status   Specimen Description BLOOD LEFT HAND  Final   Special Requests BOTTLES DRAWN AEROBIC ONLY 10CC  Final   Culture NO GROWTH 5 DAYS  Final   Report Status 07/09/2016 FINAL  Final  Culture, respiratory (NON-Expectorated)     Status: None   Collection Time: 07/04/16  4:03 PM  Result Value Ref Range Status   Specimen Description TRACHEAL ASPIRATE  Final   Special Requests NONE  Final   Gram Stain   Final    ABUNDANT WBC PRESENT, PREDOMINANTLY PMN RARE GRAM NEGATIVE  RODS RARE YEAST    Culture FEW KLEBSIELLA OXYTOCA  Final   Report Status 07/06/2016 FINAL  Final   Organism ID, Bacteria KLEBSIELLA OXYTOCA  Final      Susceptibility   Klebsiella oxytoca - MIC*    AMPICILLIN >=32 RESISTANT Resistant     CEFAZOLIN <=4 SENSITIVE Sensitive     CEFEPIME <=1 SENSITIVE Sensitive     CEFTAZIDIME <=1 SENSITIVE Sensitive     CEFTRIAXONE <=1 SENSITIVE Sensitive     CIPROFLOXACIN <=0.25 SENSITIVE Sensitive     GENTAMICIN <=1 SENSITIVE Sensitive     IMIPENEM <=0.25 SENSITIVE Sensitive     TRIMETH/SULFA <=20 SENSITIVE Sensitive     AMPICILLIN/SULBACTAM 8 SENSITIVE Sensitive     PIP/TAZO <=4 SENSITIVE Sensitive     Extended ESBL NEGATIVE Sensitive     * FEW KLEBSIELLA OXYTOCA  Culture, Urine     Status: Abnormal   Collection Time: 07/04/16  5:55 PM  Result Value Ref Range Status   Specimen Description URINE, RANDOM  Final   Special Requests NONE  Final   Culture >=100,000 COLONIES/mL YEAST (A)  Final   Report Status 07/05/2016 FINAL  Final  Culture, respiratory (NON-Expectorated)     Status: None   Collection Time: 07/09/16  2:26 PM  Result Value Ref Range Status   Specimen Description TRACHEAL ASPIRATE  Final   Special Requests NONE  Final   Gram Stain   Final    MODERATE WBC PRESENT, PREDOMINANTLY PMN NO ORGANISMS SEEN    Culture   Final    ABUNDANT PSEUDOMONAS AERUGINOSA MODERATE KLEBSIELLA OXYTOCA    Report Status 07/13/2016 FINAL  Final   Organism ID, Bacteria PSEUDOMONAS AERUGINOSA  Final   Organism ID, Bacteria KLEBSIELLA OXYTOCA  Final      Susceptibility   Klebsiella oxytoca - MIC*    AMPICILLIN >=32 RESISTANT Resistant     CEFAZOLIN <=4 SENSITIVE Sensitive     CEFEPIME <=1 SENSITIVE Sensitive     CEFTAZIDIME <=1 SENSITIVE Sensitive     CEFTRIAXONE <=1 SENSITIVE Sensitive     CIPROFLOXACIN <=0.25 SENSITIVE Sensitive     GENTAMICIN <=1 SENSITIVE Sensitive     IMIPENEM <=0.25 SENSITIVE Sensitive     TRIMETH/SULFA <=20 SENSITIVE  Sensitive     AMPICILLIN/SULBACTAM  8 SENSITIVE Sensitive     PIP/TAZO <=4 SENSITIVE Sensitive     Extended ESBL NEGATIVE Sensitive     * MODERATE KLEBSIELLA OXYTOCA   Pseudomonas aeruginosa - MIC*    CEFTAZIDIME 4 SENSITIVE Sensitive     CIPROFLOXACIN 0.5 SENSITIVE Sensitive     GENTAMICIN <=1 SENSITIVE Sensitive     IMIPENEM 2 SENSITIVE Sensitive     PIP/TAZO 16 SENSITIVE Sensitive     CEFEPIME 4 SENSITIVE Sensitive     * ABUNDANT PSEUDOMONAS AERUGINOSA    Coagulation Studies:  Recent Labs  07/15/16 1528 07/15/16 1639  LABPROT >90.0* 18.4*  INR >10.00* 1.52    Urinalysis: No results for input(s): COLORURINE, LABSPEC, PHURINE, GLUCOSEU, HGBUR, BILIRUBINUR, KETONESUR, PROTEINUR, UROBILINOGEN, NITRITE, LEUKOCYTESUR in the last 72 hours.  Invalid input(s): APPERANCEUR    Imaging: Ct Chest Wo Contrast  Result Date: 07/15/2016 CLINICAL DATA:  Assess pulmonary infiltrates seen on chest radiograph. Initial encounter. EXAM: CT CHEST WITHOUT CONTRAST TECHNIQUE: Multidetector CT imaging of the chest was performed following the standard protocol without IV contrast. COMPARISON:  Chest radiograph performed 07/14/2016 FINDINGS: Cardiovascular: The heart is borderline normal in size. Scattered coronary artery calcifications are seen. Scattered calcification is noted along the aortic arch and descending thoracic aorta. The great vessels are grossly unremarkable, though difficult to fully assess without contrast. Mediastinum/Nodes: The patient's endotracheal tube is seen ending 4 cm above the carina. A 1.4 cm subcarinal node is seen. No additional mediastinal lymphadenopathy is appreciated. No pericardial effusion is seen. The thyroid gland is grossly unremarkable in appearance. A left PICC is noted ending about the mid SVC. A right IJ line is noted ending about the distal SVC. No axillary lymphadenopathy is appreciated. Minimal bilateral gynecomastia is noted. Lungs/Pleura: Small bilateral pleural  effusions are noted, left larger than right. Patchy bilateral airspace opacification, most prominent at the lung apices, raises concern for multifocal infection, though mild superimposed edema cannot be excluded. No pneumothorax is seen. Given the metastatic bony disease described below, a 5.1 cm left basilar lung mass cannot be excluded. Upper Abdomen: The visualized portions of the liver and spleen are grossly unremarkable. The patient is status post cholecystectomy, with clips noted at the gallbladder fossa. The visualized portions of the pancreas and adrenal glands are within normal limits. Musculoskeletal: Multiple sclerotic lesions throughout the thoracic spine, involving nearly the entirety of vertebral body T9, though also at T3, T4, T7, T10 and T11, are concerning for metastatic disease. Cervical spinal fusion hardware is partially imaged. The visualized musculature is unremarkable in appearance. IMPRESSION: 1. Small bilateral pleural effusions, left larger than right. Patchy bilateral airspace opacification, most prominent at the lung apices, raises concern for multifocal infection, though mild superimposed pulmonary edema cannot be excluded. 2. Multiple sclerotic lesions throughout the thoracic spine, involving nearly the entirety vertebral body T9, though also seen at T3, T4, T7, T10 and T11, concerning for metastatic disease. 3. Given the metastatic bony disease, a 5.1 cm left basilar lung mass cannot be excluded. If no prior primary malignancy has been diagnosed, PET/CT could be considered for further evaluation, to assess for primary malignancy and extent of metastatic disease. 4. 1.4 cm subcarinal node noted. 5. Scattered coronary artery calcifications seen. Electronically Signed   By: Garald Balding M.D.   On: 07/15/2016 19:53   Dg Abd Portable 1v  Result Date: 07/15/2016 CLINICAL DATA:  Nasogastric tube placement.  Initial encounter. EXAM: PORTABLE ABDOMEN - 1 VIEW COMPARISON:  Abdominal  radiograph performed earlier today at 10:35  a.m. FINDINGS: The patient's enteric tube is noted ending overlying the body of the stomach. The side-port is seen about the gastroesophageal junction. The visualized bowel gas pattern is grossly unremarkable. No free intra-abdominal air is seen, though evaluation for free air is limited on a single supine view. Clips are noted within the right upper quadrant, reflecting prior cholecystectomy. No acute osseous abnormalities are seen. IMPRESSION: Enteric tube noted ending about the body of the stomach. Side port about the gastroesophageal junction. Electronically Signed   By: Garald Balding M.D.   On: 07/15/2016 19:55     Medications:       Assessment/ Plan:  71 y.o.  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   1. Acute Renal failure / Solitary rt Pelvic Kidney 2. CKD st 3. Baseline Cr 1.27 (d/c from [facility]) Jun 29, 2016 3. B/L Pleural effusions and Acute respiratory failure requiring ventilator support 4. Anasarca/Generalized edema - Low albumin  - 3rd spacing of fluid  5. Fever. 6. Anemia of CKD. PRBC transfusion on 2/2  Plan:  Overall patient remains critically ill. He remains on the ventilator.  BUN  High again at 100 (reduced from 131 prior to dialysis) Albumin low at 1.4 Continue hemodialysis MWF schedule with IV albumin. Aim for 2-3 litres ultrafiltration.  Monitor for renal recovery.    LOS: 0 Charlene Detter 2/2/20184:46 PM

## 2016-07-18 LAB — TYPE AND SCREEN
BLOOD PRODUCT EXPIRATION DATE: 201803012359
ISSUE DATE / TIME: 201802021810
UNIT TYPE AND RH: 5100

## 2016-07-18 LAB — POTASSIUM: Potassium: 4.4 mmol/L (ref 3.5–5.1)

## 2016-07-19 LAB — CBC
HEMATOCRIT: 23.8 % — AB (ref 39.0–52.0)
HEMOGLOBIN: 7.7 g/dL — AB (ref 13.0–17.0)
MCH: 28.1 pg (ref 26.0–34.0)
MCHC: 32.4 g/dL (ref 30.0–36.0)
MCV: 86.9 fL (ref 78.0–100.0)
Platelets: 331 10*3/uL (ref 150–400)
RBC: 2.74 MIL/uL — ABNORMAL LOW (ref 4.22–5.81)
RDW: 15.4 % (ref 11.5–15.5)
WBC: 15.2 10*3/uL — AB (ref 4.0–10.5)

## 2016-07-19 LAB — BASIC METABOLIC PANEL
ANION GAP: 13 (ref 5–15)
BUN: 88 mg/dL — ABNORMAL HIGH (ref 6–20)
CHLORIDE: 97 mmol/L — AB (ref 101–111)
CO2: 21 mmol/L — AB (ref 22–32)
Calcium: 7.9 mg/dL — ABNORMAL LOW (ref 8.9–10.3)
Creatinine, Ser: 5 mg/dL — ABNORMAL HIGH (ref 0.61–1.24)
GFR calc Af Amer: 12 mL/min — ABNORMAL LOW (ref >60)
GFR calc non Af Amer: 11 mL/min — ABNORMAL LOW (ref >60)
GLUCOSE: 341 mg/dL — AB (ref 65–99)
Potassium: 4.3 mmol/L (ref 3.5–5.1)
Sodium: 131 mmol/L — ABNORMAL LOW (ref 135–145)

## 2016-07-20 LAB — CBC
HCT: 24.2 % — ABNORMAL LOW (ref 39.0–52.0)
HCT: 25.7 % — ABNORMAL LOW (ref 39.0–52.0)
HEMOGLOBIN: 8 g/dL — AB (ref 13.0–17.0)
HEMOGLOBIN: 8.4 g/dL — AB (ref 13.0–17.0)
MCH: 28.3 pg (ref 26.0–34.0)
MCH: 27.6 pg (ref 26.0–34.0)
MCHC: 33.1 g/dL (ref 30.0–36.0)
MCHC: 32.7 g/dL (ref 30.0–36.0)
MCV: 85.5 fL (ref 78.0–100.0)
MCV: 84.5 fL (ref 78.0–100.0)
PLATELETS: 321 10*3/uL (ref 150–400)
Platelets: 333 10*3/uL (ref 150–400)
RBC: 2.83 MIL/uL — AB (ref 4.22–5.81)
RBC: 3.04 MIL/uL — AB (ref 4.22–5.81)
RDW: 15.4 % (ref 11.5–15.5)
RDW: 15 % (ref 11.5–15.5)
WBC: 12.8 10*3/uL — ABNORMAL HIGH (ref 4.0–10.5)
WBC: 13.2 10*3/uL — ABNORMAL HIGH (ref 4.0–10.5)

## 2016-07-20 LAB — MAGNESIUM: MAGNESIUM: 2.6 mg/dL — AB (ref 1.7–2.4)

## 2016-07-20 LAB — CK TOTAL AND CKMB (NOT AT ARMC)
CK, MB: 3.9 ng/mL (ref 0.5–5.0)
RELATIVE INDEX: 3.4 — AB (ref 0.0–2.5)
Total CK: 114 U/L (ref 49–397)

## 2016-07-20 LAB — RENAL FUNCTION PANEL
Albumin: 1.8 g/dL — ABNORMAL LOW (ref 3.5–5.0)
Anion gap: 10 (ref 5–15)
BUN: 125 mg/dL — AB (ref 6–20)
CALCIUM: 7.7 mg/dL — AB (ref 8.9–10.3)
CHLORIDE: 99 mmol/L — AB (ref 101–111)
CO2: 23 mmol/L (ref 22–32)
CREATININE: 5.82 mg/dL — AB (ref 0.61–1.24)
GFR, EST AFRICAN AMERICAN: 10 mL/min — AB (ref >60)
GFR, EST NON AFRICAN AMERICAN: 9 mL/min — AB (ref >60)
Glucose, Bld: 319 mg/dL — ABNORMAL HIGH (ref 65–99)
Phosphorus: 7.1 mg/dL — ABNORMAL HIGH (ref 2.5–4.6)
Potassium: 4.2 mmol/L (ref 3.5–5.1)
SODIUM: 132 mmol/L — AB (ref 135–145)

## 2016-07-20 LAB — BASIC METABOLIC PANEL
ANION GAP: 11 (ref 5–15)
BUN: 57 mg/dL — ABNORMAL HIGH (ref 6–20)
CALCIUM: 7.7 mg/dL — AB (ref 8.9–10.3)
CHLORIDE: 97 mmol/L — AB (ref 101–111)
CO2: 25 mmol/L (ref 22–32)
Creatinine, Ser: 3.32 mg/dL — ABNORMAL HIGH (ref 0.61–1.24)
GFR calc non Af Amer: 17 mL/min — ABNORMAL LOW (ref >60)
GFR, EST AFRICAN AMERICAN: 20 mL/min — AB (ref >60)
Glucose, Bld: 250 mg/dL — ABNORMAL HIGH (ref 65–99)
Potassium: 3.3 mmol/L — ABNORMAL LOW (ref 3.5–5.1)
Sodium: 133 mmol/L — ABNORMAL LOW (ref 135–145)

## 2016-07-20 LAB — TROPONIN I: Troponin I: 0.03 ng/mL (ref <0.03)

## 2016-07-20 NOTE — Progress Notes (Signed)
[facility]  ROUNDING NOTE   Subjective:  Patient received hemodialysis today. Ultrafiltration achieved was 3.1 kg. Remains on the ventilator at this time.    Objective:  Vital signs in last 24 hours:  Temperature 98.5 pulse 105 respirations 30 blood pressure 184/98  Physical Exam: General: Critically ill appearing  Head: ETT    Eyes: Anicteric  Neck: Supple  Lungs:  Bilateral rhonchi, vent assisted fio2 35%  Heart: S1S2 no rubs, irregular  Abdomen:  Soft, nontender,   Extremities: Right BKA, 1+ LLE edema  Neurologic: Awake, alert, nods yes/no to questions  Skin: No acute rashes  Access: R IJ temporary dialysis catheter    Basic Metabolic Panel:  Recent Labs Lab 07/15/16 1528 07/17/16 0649 07/18/16 0500 07/19/16 0748 07/20/16 0718 07/20/16 1250  NA 134* 136  --  131* 132* 133*  K 3.5 3.3* 4.4 4.3 4.2 3.3*  CL 99* 101  --  97* 99* 97*  CO2 22 25  --  21* 23 25  GLUCOSE 214* 151*  --  341* 319* 250*  BUN 100* 73*  --  88* 125* 57*  CREATININE 4.60* 4.68*  --  5.00* 5.82* 3.32*  CALCIUM 7.7* 7.7*  --  7.9* 7.7* 7.7*  MG  --   --   --   --  2.6*  --   PHOS 5.4* 4.9*  --   --  7.1*  --     Liver Function Tests:  Recent Labs Lab 07/15/16 1528 07/17/16 0649 07/20/16 0718  ALBUMIN 1.4* 1.4* 1.8*   No results for input(s): LIPASE, AMYLASE in the last 168 hours. No results for input(s): AMMONIA in the last 168 hours.  CBC:  Recent Labs Lab 07/15/16 1528 07/17/16 0647 07/19/16 0748 07/20/16 0718 07/20/16 1250  WBC 9.6 8.3 15.2* 12.8* 13.2*  HGB 7.3* 6.7* 7.7* 8.0* 8.4*  HCT 23.3* 20.6* 23.8* 24.2* 25.7*  MCV 86.9 88.0 86.9 85.5 84.5  PLT 270 307 331 321 333    Cardiac Enzymes:  Recent Labs Lab 07/20/16 1250  CKTOTAL 114  CKMB 3.9  TROPONINI 0.03*    BNP: Invalid input(s): POCBNP  CBG: No results for input(s): GLUCAP in the last 168 hours.  Microbiology: Results for orders placed or performed during the hospital  encounter of 06/29/16  Culture, blood (routine x 2)     Status: None   Collection Time: 06/29/16  6:12 PM  Result Value Ref Range Status   Specimen Description BLOOD LEFT HAND  Final   Special Requests IN PEDIATRIC BOTTLE [place]  Final   Culture NO GROWTH 5 DAYS  Final   Report Status 07/04/2016 FINAL  Final  Culture, blood (routine x 2)     Status: None   Collection Time: 06/29/16  6:23 PM  Result Value Ref Range Status   Specimen Description BLOOD RIGHT HAND  Final   Special Requests BOTTLES DRAWN AEROBIC AND ANAEROBIC 5CC  Final   Culture NO GROWTH 5 DAYS  Final   Report Status 07/04/2016 FINAL  Final  Urine culture     Status: None   Collection Time: 06/30/16  7:39 AM  Result Value Ref Range Status   Specimen Description URINE, RANDOM  Final   Special Requests NONE  Final   Culture NO GROWTH  Final   Report Status 07/01/2016 FINAL  Final  Culture, blood (routine x 2)     Status: None   Collection Time: 07/04/16  2:58 PM  Result Value Ref Range Status  Specimen Description BLOOD LEFT HAND  Final   Special Requests BOTTLES DRAWN AEROBIC ONLY 10CC  Final   Culture NO GROWTH 5 DAYS  Final   Report Status 07/09/2016 FINAL  Final  Culture, blood (routine x 2)     Status: None   Collection Time: 07/04/16  3:02 PM  Result Value Ref Range Status   Specimen Description BLOOD LEFT HAND  Final   Special Requests BOTTLES DRAWN AEROBIC ONLY 10CC  Final   Culture NO GROWTH 5 DAYS  Final   Report Status 07/09/2016 FINAL  Final  Culture, respiratory (NON-Expectorated)     Status: None   Collection Time: 07/04/16  4:03 PM  Result Value Ref Range Status   Specimen Description TRACHEAL ASPIRATE  Final   Special Requests NONE  Final   Gram Stain   Final    ABUNDANT WBC PRESENT, PREDOMINANTLY PMN RARE GRAM NEGATIVE RODS RARE YEAST    Culture FEW KLEBSIELLA OXYTOCA  Final   Report Status 07/06/2016 FINAL  Final   Organism ID, Bacteria KLEBSIELLA OXYTOCA  Final      Susceptibility    Klebsiella oxytoca - MIC*    AMPICILLIN >=32 RESISTANT Resistant     CEFAZOLIN <=4 SENSITIVE Sensitive     CEFEPIME <=1 SENSITIVE Sensitive     CEFTAZIDIME <=1 SENSITIVE Sensitive     CEFTRIAXONE <=1 SENSITIVE Sensitive     CIPROFLOXACIN <=0.25 SENSITIVE Sensitive     GENTAMICIN <=1 SENSITIVE Sensitive     IMIPENEM <=0.25 SENSITIVE Sensitive     TRIMETH/SULFA <=20 SENSITIVE Sensitive     AMPICILLIN/SULBACTAM 8 SENSITIVE Sensitive     PIP/TAZO <=4 SENSITIVE Sensitive     Extended ESBL NEGATIVE Sensitive     * FEW KLEBSIELLA OXYTOCA  Culture, Urine     Status: Abnormal   Collection Time: 07/04/16  5:55 PM  Result Value Ref Range Status   Specimen Description URINE, RANDOM  Final   Special Requests NONE  Final   Culture >=100,000 COLONIES/mL YEAST (A)  Final   Report Status 07/05/2016 FINAL  Final  Culture, respiratory (NON-Expectorated)     Status: None   Collection Time: 07/09/16  2:26 PM  Result Value Ref Range Status   Specimen Description TRACHEAL ASPIRATE  Final   Special Requests NONE  Final   Gram Stain   Final    MODERATE WBC PRESENT, PREDOMINANTLY PMN NO ORGANISMS SEEN    Culture   Final    ABUNDANT PSEUDOMONAS AERUGINOSA MODERATE KLEBSIELLA OXYTOCA    Report Status 07/13/2016 FINAL  Final   Organism ID, Bacteria PSEUDOMONAS AERUGINOSA  Final   Organism ID, Bacteria KLEBSIELLA OXYTOCA  Final      Susceptibility   Klebsiella oxytoca - MIC*    AMPICILLIN >=32 RESISTANT Resistant     CEFAZOLIN <=4 SENSITIVE Sensitive     CEFEPIME <=1 SENSITIVE Sensitive     CEFTAZIDIME <=1 SENSITIVE Sensitive     CEFTRIAXONE <=1 SENSITIVE Sensitive     CIPROFLOXACIN <=0.25 SENSITIVE Sensitive     GENTAMICIN <=1 SENSITIVE Sensitive     IMIPENEM <=0.25 SENSITIVE Sensitive     TRIMETH/SULFA <=20 SENSITIVE Sensitive     AMPICILLIN/SULBACTAM 8 SENSITIVE Sensitive     PIP/TAZO <=4 SENSITIVE Sensitive     Extended ESBL NEGATIVE Sensitive     * MODERATE KLEBSIELLA OXYTOCA   Pseudomonas  aeruginosa - MIC*    CEFTAZIDIME 4 SENSITIVE Sensitive     CIPROFLOXACIN 0.5 SENSITIVE Sensitive     GENTAMICIN <=1 SENSITIVE Sensitive  IMIPENEM 2 SENSITIVE Sensitive     PIP/TAZO 16 SENSITIVE Sensitive     CEFEPIME 4 SENSITIVE Sensitive     * ABUNDANT PSEUDOMONAS AERUGINOSA    Coagulation Studies: No results for input(s): LABPROT, INR in the last 72 hours.  Urinalysis: No results for input(s): COLORURINE, LABSPEC, PHURINE, GLUCOSEU, HGBUR, BILIRUBINUR, KETONESUR, PROTEINUR, UROBILINOGEN, NITRITE, LEUKOCYTESUR in the last 72 hours.  Invalid input(s): APPERANCEUR    Imaging: No results found.   Medications:       Assessment/ Plan:  71 y.o.  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   1. Acute Renal failure / Solitary rt Pelvic Kidney 2. CKD st 3. Baseline Cr 1.27 (d/c from [facility]) Jun 29, 2016 3. B/L Pleural effusions and Acute respiratory failure requiring ventilator support 4. Anasarca/Generalized edema - Low albumin  - 3rd spacing of fluid  5. Fever. 6. Anemia of CKD. PRBC transfusion on 2/2  Plan:  Labs obtained postdialysis.  BUN of 57 with a creatinine of 3.32. Patient still not making very much urine at this point in time. Therefore we will plan for hemodialysis again on Wednesday. We will continue ultrafiltration to help with weaning from the ventilator. We will plan for 3 kg of ultrafiltration on Wednesday as well. Otherwise continue supportive care as you are doing.    LOS: 0 Shanti Agresti 2/5/20182:50 PM

## 2016-07-21 LAB — HEMOGLOBIN A1C
Hgb A1c MFr Bld: 8 % — ABNORMAL HIGH (ref 4.8–5.6)
MEAN PLASMA GLUCOSE: 183 mg/dL

## 2016-07-22 LAB — CBC
HEMATOCRIT: 26 % — AB (ref 39.0–52.0)
Hemoglobin: 8.3 g/dL — ABNORMAL LOW (ref 13.0–17.0)
MCH: 27.8 pg (ref 26.0–34.0)
MCHC: 31.9 g/dL (ref 30.0–36.0)
MCV: 87 fL (ref 78.0–100.0)
Platelets: 301 10*3/uL (ref 150–400)
RBC: 2.99 MIL/uL — ABNORMAL LOW (ref 4.22–5.81)
RDW: 15.1 % (ref 11.5–15.5)
WBC: 11 10*3/uL — ABNORMAL HIGH (ref 4.0–10.5)

## 2016-07-22 LAB — RENAL FUNCTION PANEL
ALBUMIN: 1.9 g/dL — AB (ref 3.5–5.0)
Anion gap: 15 (ref 5–15)
BUN: 109 mg/dL — AB (ref 6–20)
CHLORIDE: 96 mmol/L — AB (ref 101–111)
CO2: 24 mmol/L (ref 22–32)
Calcium: 7.5 mg/dL — ABNORMAL LOW (ref 8.9–10.3)
Creatinine, Ser: 5.14 mg/dL — ABNORMAL HIGH (ref 0.61–1.24)
GFR calc Af Amer: 12 mL/min — ABNORMAL LOW (ref >60)
GFR calc non Af Amer: 10 mL/min — ABNORMAL LOW (ref >60)
GLUCOSE: 129 mg/dL — AB (ref 65–99)
PHOSPHORUS: 8.5 mg/dL — AB (ref 2.5–4.6)
POTASSIUM: 4.1 mmol/L (ref 3.5–5.1)
Sodium: 135 mmol/L (ref 135–145)

## 2016-07-22 NOTE — Progress Notes (Signed)
[facility]  ROUNDING NOTE   Subjective:  atient is seen status post hemodialysis today. He tolerated this well. In the interval he has performed self extubation. He is arousable but not consistently following commands.    Objective:  Vital signs in last 24 hours:  Temperature 97.6 pulse 98 respirations 20 blood pressure 140/80  Physical Exam: General: Critically ill appearing  Head: NCAT OM moist  Eyes: Eyes closed  Neck: Supple  Lungs:  Coarse rhonchi, normal work of breathing  Heart: S1S2 no rubs, irregular  Abdomen:  Soft, nontender, bowel sound spresent  Extremities: Right BKA, 1+ LLE edema  Neurologic: Arousable but not following commands consistently  Skin: No acute rashes  Access: R IJ temporary dialysis catheter    Basic Metabolic Panel:  Recent Labs Lab 07/17/16 0649 07/18/16 0500 07/19/16 0748 07/20/16 0718 07/20/16 1250 07/22/16 0637  NA 136  --  131* 132* 133* 135  K 3.3* 4.4 4.3 4.2 3.3* 4.1  CL 101  --  97* 99* 97* 96*  CO2 25  --  21* 23 25 24   GLUCOSE 151*  --  341* 319* 250* 129*  BUN 73*  --  88* 125* 57* 109*  CREATININE 4.68*  --  5.00* 5.82* 3.32* 5.14*  CALCIUM 7.7*  --  7.9* 7.7* 7.7* 7.5*  MG  --   --   --  2.6*  --   --   PHOS 4.9*  --   --  7.1*  --  8.5*    Liver Function Tests:  Recent Labs Lab 07/17/16 0649 07/20/16 0718 07/22/16 0637  ALBUMIN 1.4* 1.8* 1.9*   No results for input(s): LIPASE, AMYLASE in the last 168 hours. No results for input(s): AMMONIA in the last 168 hours.  CBC:  Recent Labs Lab 07/17/16 0647 07/19/16 0748 07/20/16 0718 07/20/16 1250 07/22/16 0638  WBC 8.3 15.2* 12.8* 13.2* 11.0*  HGB 6.7* 7.7* 8.0* 8.4* 8.3*  HCT 20.6* 23.8* 24.2* 25.7* 26.0*  MCV 88.0 86.9 85.5 84.5 87.0  PLT 307 331 321 333 301    Cardiac Enzymes:  Recent Labs Lab 07/20/16 1250  CKTOTAL 114  CKMB 3.9  TROPONINI 0.03*    BNP: Invalid input(s): POCBNP  CBG: No results for input(s): GLUCAP in  the last 168 hours.  Microbiology: Results for orders placed or performed during the hospital encounter of 06/29/16  Culture, blood (routine x 2)     Status: None   Collection Time: 06/29/16  6:12 PM  Result Value Ref Range Status   Specimen Description BLOOD LEFT HAND  Final   Special Requests IN PEDIATRIC BOTTLE [place]  Final   Culture NO GROWTH 5 DAYS  Final   Report Status 07/04/2016 FINAL  Final  Culture, blood (routine x 2)     Status: None   Collection Time: 06/29/16  6:23 PM  Result Value Ref Range Status   Specimen Description BLOOD RIGHT HAND  Final   Special Requests BOTTLES DRAWN AEROBIC AND ANAEROBIC 5CC  Final   Culture NO GROWTH 5 DAYS  Final   Report Status 07/04/2016 FINAL  Final  Urine culture     Status: None   Collection Time: 06/30/16  7:39 AM  Result Value Ref Range Status   Specimen Description URINE, RANDOM  Final   Special Requests NONE  Final   Culture NO GROWTH  Final   Report Status 07/01/2016 FINAL  Final  Culture, blood (routine x 2)     Status: None  Collection Time: 07/04/16  2:58 PM  Result Value Ref Range Status   Specimen Description BLOOD LEFT HAND  Final   Special Requests BOTTLES DRAWN AEROBIC ONLY 10CC  Final   Culture NO GROWTH 5 DAYS  Final   Report Status 07/09/2016 FINAL  Final  Culture, blood (routine x 2)     Status: None   Collection Time: 07/04/16  3:02 PM  Result Value Ref Range Status   Specimen Description BLOOD LEFT HAND  Final   Special Requests BOTTLES DRAWN AEROBIC ONLY 10CC  Final   Culture NO GROWTH 5 DAYS  Final   Report Status 07/09/2016 FINAL  Final  Culture, respiratory (NON-Expectorated)     Status: None   Collection Time: 07/04/16  4:03 PM  Result Value Ref Range Status   Specimen Description TRACHEAL ASPIRATE  Final   Special Requests NONE  Final   Gram Stain   Final    ABUNDANT WBC PRESENT, PREDOMINANTLY PMN RARE GRAM NEGATIVE RODS RARE YEAST    Culture FEW KLEBSIELLA OXYTOCA  Final   Report Status  07/06/2016 FINAL  Final   Organism ID, Bacteria KLEBSIELLA OXYTOCA  Final      Susceptibility   Klebsiella oxytoca - MIC*    AMPICILLIN >=32 RESISTANT Resistant     CEFAZOLIN <=4 SENSITIVE Sensitive     CEFEPIME <=1 SENSITIVE Sensitive     CEFTAZIDIME <=1 SENSITIVE Sensitive     CEFTRIAXONE <=1 SENSITIVE Sensitive     CIPROFLOXACIN <=0.25 SENSITIVE Sensitive     GENTAMICIN <=1 SENSITIVE Sensitive     IMIPENEM <=0.25 SENSITIVE Sensitive     TRIMETH/SULFA <=20 SENSITIVE Sensitive     AMPICILLIN/SULBACTAM 8 SENSITIVE Sensitive     PIP/TAZO <=4 SENSITIVE Sensitive     Extended ESBL NEGATIVE Sensitive     * FEW KLEBSIELLA OXYTOCA  Culture, Urine     Status: Abnormal   Collection Time: 07/04/16  5:55 PM  Result Value Ref Range Status   Specimen Description URINE, RANDOM  Final   Special Requests NONE  Final   Culture >=100,000 COLONIES/mL YEAST (A)  Final   Report Status 07/05/2016 FINAL  Final  Culture, respiratory (NON-Expectorated)     Status: None   Collection Time: 07/09/16  2:26 PM  Result Value Ref Range Status   Specimen Description TRACHEAL ASPIRATE  Final   Special Requests NONE  Final   Gram Stain   Final    MODERATE WBC PRESENT, PREDOMINANTLY PMN NO ORGANISMS SEEN    Culture   Final    ABUNDANT PSEUDOMONAS AERUGINOSA MODERATE KLEBSIELLA OXYTOCA    Report Status 07/13/2016 FINAL  Final   Organism ID, Bacteria PSEUDOMONAS AERUGINOSA  Final   Organism ID, Bacteria KLEBSIELLA OXYTOCA  Final      Susceptibility   Klebsiella oxytoca - MIC*    AMPICILLIN >=32 RESISTANT Resistant     CEFAZOLIN <=4 SENSITIVE Sensitive     CEFEPIME <=1 SENSITIVE Sensitive     CEFTAZIDIME <=1 SENSITIVE Sensitive     CEFTRIAXONE <=1 SENSITIVE Sensitive     CIPROFLOXACIN <=0.25 SENSITIVE Sensitive     GENTAMICIN <=1 SENSITIVE Sensitive     IMIPENEM <=0.25 SENSITIVE Sensitive     TRIMETH/SULFA <=20 SENSITIVE Sensitive     AMPICILLIN/SULBACTAM 8 SENSITIVE Sensitive     PIP/TAZO <=4  SENSITIVE Sensitive     Extended ESBL NEGATIVE Sensitive     * MODERATE KLEBSIELLA OXYTOCA   Pseudomonas aeruginosa - MIC*    CEFTAZIDIME 4 SENSITIVE Sensitive  CIPROFLOXACIN 0.5 SENSITIVE Sensitive     GENTAMICIN <=1 SENSITIVE Sensitive     IMIPENEM 2 SENSITIVE Sensitive     PIP/TAZO 16 SENSITIVE Sensitive     CEFEPIME 4 SENSITIVE Sensitive     * ABUNDANT PSEUDOMONAS AERUGINOSA    Coagulation Studies: No results for input(s): LABPROT, INR in the last 72 hours.  Urinalysis: No results for input(s): COLORURINE, LABSPEC, PHURINE, GLUCOSEU, HGBUR, BILIRUBINUR, KETONESUR, PROTEINUR, UROBILINOGEN, NITRITE, LEUKOCYTESUR in the last 72 hours.  Invalid input(s): APPERANCEUR    Imaging: No results found.   Medications:       Assessment/ Plan:  71 y.o.  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   1. Acute Renal failure / Solitary rt Pelvic Kidney 2. CKD st 3. Baseline Cr 1.27 (d/c from [facility]) Jun 29, 2016 3. B/L Pleural effusions and Acute respiratory failure requiring ventilator support 4. Anasarca/Generalized edema - Low albumin  - 3rd spacing of fluid  5. Fever. 6. Anemia of CKD. PRBC transfusion on 2/2  Plan:  Overall renal function remains quite low. Therefore he would need continued hemodialysis at this point in time. 2 soon to determine if the patient has end-stage renal disease. He has performed self extubation. He has coarse rhonchi on exam. Recommend continued monitoring of his respiratory status closely. We will plan for hemodialysis again on Friday. Otherwise continue supportive care. Overall guarded prognosis.   LOS: 0 Awa Bachicha 2/7/20183:33 PM

## 2016-07-23 ENCOUNTER — Other Ambulatory Visit (HOSPITAL_COMMUNITY): Payer: Federal, State, Local not specified - PPO

## 2016-07-23 LAB — RENAL FUNCTION PANEL
Albumin: 1.9 g/dL — ABNORMAL LOW (ref 3.5–5.0)
Anion gap: 13 (ref 5–15)
BUN: 75 mg/dL — ABNORMAL HIGH (ref 6–20)
CALCIUM: 7.5 mg/dL — AB (ref 8.9–10.3)
CHLORIDE: 94 mmol/L — AB (ref 101–111)
CO2: 26 mmol/L (ref 22–32)
Creatinine, Ser: 4.31 mg/dL — ABNORMAL HIGH (ref 0.61–1.24)
GFR calc non Af Amer: 13 mL/min — ABNORMAL LOW (ref >60)
GFR, EST AFRICAN AMERICAN: 15 mL/min — AB (ref >60)
GLUCOSE: 122 mg/dL — AB (ref 65–99)
Phosphorus: 6.9 mg/dL — ABNORMAL HIGH (ref 2.5–4.6)
Potassium: 3.8 mmol/L (ref 3.5–5.1)
SODIUM: 133 mmol/L — AB (ref 135–145)

## 2016-07-23 LAB — CBC
HCT: 25.5 % — ABNORMAL LOW (ref 39.0–52.0)
Hemoglobin: 8.1 g/dL — ABNORMAL LOW (ref 13.0–17.0)
MCH: 27.5 pg (ref 26.0–34.0)
MCHC: 31.8 g/dL (ref 30.0–36.0)
MCV: 86.4 fL (ref 78.0–100.0)
Platelets: 267 10*3/uL (ref 150–400)
RBC: 2.95 MIL/uL — AB (ref 4.22–5.81)
RDW: 14.9 % (ref 11.5–15.5)
WBC: 10.8 10*3/uL — ABNORMAL HIGH (ref 4.0–10.5)

## 2016-07-23 LAB — URINALYSIS, ROUTINE W REFLEX MICROSCOPIC
Bilirubin Urine: NEGATIVE
KETONES UR: NEGATIVE mg/dL
NITRITE: NEGATIVE
Protein, ur: 100 mg/dL — AB
SQUAMOUS EPITHELIAL / LPF: NONE SEEN
Specific Gravity, Urine: 1.009 (ref 1.005–1.030)
pH: 7 (ref 5.0–8.0)

## 2016-07-23 LAB — EXPECTORATED SPUTUM ASSESSMENT W GRAM STAIN, RFLX TO RESP C

## 2016-07-23 LAB — EXPECTORATED SPUTUM ASSESSMENT W REFEX TO RESP CULTURE

## 2016-07-24 ENCOUNTER — Other Ambulatory Visit (HOSPITAL_COMMUNITY): Payer: Federal, State, Local not specified - PPO

## 2016-07-24 LAB — BLOOD CULTURE ID PANEL (REFLEXED)
ACINETOBACTER BAUMANNII: NOT DETECTED
Candida albicans: NOT DETECTED
Candida glabrata: NOT DETECTED
Candida krusei: NOT DETECTED
Candida parapsilosis: NOT DETECTED
Candida tropicalis: NOT DETECTED
ENTEROCOCCUS SPECIES: NOT DETECTED
ESCHERICHIA COLI: NOT DETECTED
Enterobacter cloacae complex: NOT DETECTED
Enterobacteriaceae species: NOT DETECTED
HAEMOPHILUS INFLUENZAE: NOT DETECTED
Klebsiella oxytoca: NOT DETECTED
Klebsiella pneumoniae: NOT DETECTED
Listeria monocytogenes: NOT DETECTED
METHICILLIN RESISTANCE: DETECTED — AB
NEISSERIA MENINGITIDIS: NOT DETECTED
PROTEUS SPECIES: NOT DETECTED
PSEUDOMONAS AERUGINOSA: NOT DETECTED
SERRATIA MARCESCENS: NOT DETECTED
STAPHYLOCOCCUS AUREUS BCID: NOT DETECTED
STAPHYLOCOCCUS SPECIES: DETECTED — AB
STREPTOCOCCUS AGALACTIAE: NOT DETECTED
STREPTOCOCCUS PNEUMONIAE: NOT DETECTED
Streptococcus pyogenes: NOT DETECTED
Streptococcus species: NOT DETECTED

## 2016-07-24 NOTE — Progress Notes (Signed)
[facility]  ROUNDING NOTE   Subjective:  Patient remains off the ventilator. He is due for hemodialysis again today. Wife at the bedside. Patient found to have bacteremia.   Objective:  Vital signs in last 24 hours:  Temperature 98.3 pulse 108 respirations 18 blood pressure 166/80  Physical Exam: General: Critically ill appearing  Head: NCAT OM moist  Eyes: Eyes open  Neck: Supple  Lungs:  Coarse rhonchi, normal work of breathing  Heart: S1S2 no rubs, irregular  Abdomen:  Soft, nontender, bowel sound spresent  Extremities: Right BKA, 1+ LLE edema  Neurologic: Arousable but not following commands consistently  Skin: No acute rashes  Access: R IJ temporary dialysis catheter    Basic Metabolic Panel:  Recent Labs Lab 07/19/16 0748 07/20/16 0718 07/20/16 1250 07/22/16 0637 07/23/16 0647  NA 131* 132* 133* 135 133*  K 4.3 4.2 3.3* 4.1 3.8  CL 97* 99* 97* 96* 94*  CO2 21* 23 25 24 26   GLUCOSE 341* 319* 250* 129* 122*  BUN 88* 125* 57* 109* 75*  CREATININE 5.00* 5.82* 3.32* 5.14* 4.31*  CALCIUM 7.9* 7.7* 7.7* 7.5* 7.5*  MG  --  2.6*  --   --   --   PHOS  --  7.1*  --  8.5* 6.9*    Liver Function Tests:  Recent Labs Lab 07/20/16 0718 07/22/16 0637 07/23/16 0647  ALBUMIN 1.8* 1.9* 1.9*   No results for input(s): LIPASE, AMYLASE in the last 168 hours. No results for input(s): AMMONIA in the last 168 hours.  CBC:  Recent Labs Lab 07/19/16 0748 07/20/16 0718 07/20/16 1250 07/22/16 0638 07/23/16 0647  WBC 15.2* 12.8* 13.2* 11.0* 10.8*  HGB 7.7* 8.0* 8.4* 8.3* 8.1*  HCT 23.8* 24.2* 25.7* 26.0* 25.5*  MCV 86.9 85.5 84.5 87.0 86.4  PLT 331 321 333 301 267    Cardiac Enzymes:  Recent Labs Lab 07/20/16 1250  CKTOTAL 114  CKMB 3.9  TROPONINI 0.03*    BNP: Invalid input(s): POCBNP  CBG: No results for input(s): GLUCAP in the last 168 hours.  Microbiology: Results for orders placed or performed during the hospital encounter of  06/29/16  Culture, blood (routine x 2)     Status: None   Collection Time: 06/29/16  6:12 PM  Result Value Ref Range Status   Specimen Description BLOOD LEFT HAND  Final   Special Requests IN PEDIATRIC BOTTLE [place]  Final   Culture NO GROWTH 5 DAYS  Final   Report Status 07/04/2016 FINAL  Final  Culture, blood (routine x 2)     Status: None   Collection Time: 06/29/16  6:23 PM  Result Value Ref Range Status   Specimen Description BLOOD RIGHT HAND  Final   Special Requests BOTTLES DRAWN AEROBIC AND ANAEROBIC 5CC  Final   Culture NO GROWTH 5 DAYS  Final   Report Status 07/04/2016 FINAL  Final  Urine culture     Status: None   Collection Time: 06/30/16  7:39 AM  Result Value Ref Range Status   Specimen Description URINE, RANDOM  Final   Special Requests NONE  Final   Culture NO GROWTH  Final   Report Status 07/01/2016 FINAL  Final  Culture, blood (routine x 2)     Status: None   Collection Time: 07/04/16  2:58 PM  Result Value Ref Range Status   Specimen Description BLOOD LEFT HAND  Final   Special Requests BOTTLES DRAWN AEROBIC ONLY 10CC  Final   Culture NO  GROWTH 5 DAYS  Final   Report Status 07/09/2016 FINAL  Final  Culture, blood (routine x 2)     Status: None   Collection Time: 07/04/16  3:02 PM  Result Value Ref Range Status   Specimen Description BLOOD LEFT HAND  Final   Special Requests BOTTLES DRAWN AEROBIC ONLY 10CC  Final   Culture NO GROWTH 5 DAYS  Final   Report Status 07/09/2016 FINAL  Final  Culture, respiratory (NON-Expectorated)     Status: None   Collection Time: 07/04/16  4:03 PM  Result Value Ref Range Status   Specimen Description TRACHEAL ASPIRATE  Final   Special Requests NONE  Final   Gram Stain   Final    ABUNDANT WBC PRESENT, PREDOMINANTLY PMN RARE GRAM NEGATIVE RODS RARE YEAST    Culture FEW KLEBSIELLA OXYTOCA  Final   Report Status 07/06/2016 FINAL  Final   Organism ID, Bacteria KLEBSIELLA OXYTOCA  Final      Susceptibility   Klebsiella oxytoca  - MIC*    AMPICILLIN >=32 RESISTANT Resistant     CEFAZOLIN <=4 SENSITIVE Sensitive     CEFEPIME <=1 SENSITIVE Sensitive     CEFTAZIDIME <=1 SENSITIVE Sensitive     CEFTRIAXONE <=1 SENSITIVE Sensitive     CIPROFLOXACIN <=0.25 SENSITIVE Sensitive     GENTAMICIN <=1 SENSITIVE Sensitive     IMIPENEM <=0.25 SENSITIVE Sensitive     TRIMETH/SULFA <=20 SENSITIVE Sensitive     AMPICILLIN/SULBACTAM 8 SENSITIVE Sensitive     PIP/TAZO <=4 SENSITIVE Sensitive     Extended ESBL NEGATIVE Sensitive     * FEW KLEBSIELLA OXYTOCA  Culture, Urine     Status: Abnormal   Collection Time: 07/04/16  5:55 PM  Result Value Ref Range Status   Specimen Description URINE, RANDOM  Final   Special Requests NONE  Final   Culture >=100,000 COLONIES/mL YEAST (A)  Final   Report Status 07/05/2016 FINAL  Final  Culture, respiratory (NON-Expectorated)     Status: None   Collection Time: 07/09/16  2:26 PM  Result Value Ref Range Status   Specimen Description TRACHEAL ASPIRATE  Final   Special Requests NONE  Final   Gram Stain   Final    MODERATE WBC PRESENT, PREDOMINANTLY PMN NO ORGANISMS SEEN    Culture   Final    ABUNDANT PSEUDOMONAS AERUGINOSA MODERATE KLEBSIELLA OXYTOCA    Report Status 07/13/2016 FINAL  Final   Organism ID, Bacteria PSEUDOMONAS AERUGINOSA  Final   Organism ID, Bacteria KLEBSIELLA OXYTOCA  Final      Susceptibility   Klebsiella oxytoca - MIC*    AMPICILLIN >=32 RESISTANT Resistant     CEFAZOLIN <=4 SENSITIVE Sensitive     CEFEPIME <=1 SENSITIVE Sensitive     CEFTAZIDIME <=1 SENSITIVE Sensitive     CEFTRIAXONE <=1 SENSITIVE Sensitive     CIPROFLOXACIN <=0.25 SENSITIVE Sensitive     GENTAMICIN <=1 SENSITIVE Sensitive     IMIPENEM <=0.25 SENSITIVE Sensitive     TRIMETH/SULFA <=20 SENSITIVE Sensitive     AMPICILLIN/SULBACTAM 8 SENSITIVE Sensitive     PIP/TAZO <=4 SENSITIVE Sensitive     Extended ESBL NEGATIVE Sensitive     * MODERATE KLEBSIELLA OXYTOCA   Pseudomonas aeruginosa - MIC*     CEFTAZIDIME 4 SENSITIVE Sensitive     CIPROFLOXACIN 0.5 SENSITIVE Sensitive     GENTAMICIN <=1 SENSITIVE Sensitive     IMIPENEM 2 SENSITIVE Sensitive     PIP/TAZO 16 SENSITIVE Sensitive     CEFEPIME 4  SENSITIVE Sensitive     * ABUNDANT PSEUDOMONAS AERUGINOSA  Culture, expectorated sputum-assessment     Status: None   Collection Time: 07/23/16 12:26 PM  Result Value Ref Range Status   Specimen Description EXPECTORATED SPUTUM  Final   Special Requests NONE  Final   Sputum evaluation THIS SPECIMEN IS ACCEPTABLE FOR SPUTUM CULTURE  Final   Report Status 07/23/2016 FINAL  Final  Culture, respiratory (NON-Expectorated)     Status: None (Preliminary result)   Collection Time: 07/23/16 12:26 PM  Result Value Ref Range Status   Specimen Description EXPECTORATED SPUTUM  Final   Special Requests NONE Reflexed from [id number]  Final   Gram Stain   Final    ABUNDANT WBC PRESENT, PREDOMINANTLY PMN FEW SQUAMOUS EPITHELIAL CELLS PRESENT FEW GRAM POSITIVE COCCI IN PAIRS RARE GRAM NEGATIVE COCCOBACILLI    Culture CULTURE REINCUBATED FOR BETTER GROWTH  Final   Report Status PENDING  Incomplete  Culture, blood (routine x 2)     Status: None (Preliminary result)   Collection Time: 07/23/16  2:50 PM  Result Value Ref Range Status   Specimen Description BLOOD RIGHT HAND  Final   Special Requests IN PEDIATRIC BOTTLE 2.5CC  Final   Culture NO GROWTH < 24 HOURS  Final   Report Status PENDING  Incomplete  Culture, blood (routine x 2)     Status: None (Preliminary result)   Collection Time: 07/23/16  2:55 PM  Result Value Ref Range Status   Specimen Description BLOOD LEFT HAND  Final   Special Requests IN PEDIATRIC BOTTLE 2.5CC  Final   Culture  Setup Time   Final    GRAM POSITIVE COCCI IN CLUSTERS IN PEDIATRIC BOTTLE Organism ID to follow CRITICAL RESULT CALLED TO, READ BACK BY AND VERIFIED WITH: Ezzard Standing RN 14:55 07/24/16  ([email or web address])    Culture NO GROWTH < 24 HOURS  Final   Report Status PENDING   Incomplete  Blood Culture ID Panel (Reflexed)     Status: Abnormal   Collection Time: 07/23/16  2:55 PM  Result Value Ref Range Status   Enterococcus species NOT DETECTED NOT DETECTED Final   Listeria monocytogenes NOT DETECTED NOT DETECTED Final   Staphylococcus species DETECTED (A) NOT DETECTED Final    Comment: Methicillin (oxacillin) resistant coagulase negative staphylococcus. Possible blood culture contaminant (unless isolated from more than one blood culture draw or clinical case suggests pathogenicity). No antibiotic treatment is indicated for blood  culture contaminants. CRITICAL RESULT CALLED TO, READ BACK BY AND VERIFIED WITH: Ezzard Standing RN 14:55 07/24/16 ([email or web address])    Staphylococcus aureus NOT DETECTED NOT DETECTED Final   Methicillin resistance DETECTED (A) NOT DETECTED Final    Comment: CRITICAL RESULT CALLED TO, READ BACK BY AND VERIFIED WITH: Ezzard Standing RN 14:55 07/24/16 ([email or web address])    Streptococcus species NOT DETECTED NOT DETECTED Final   Streptococcus agalactiae NOT DETECTED NOT DETECTED Final   Streptococcus pneumoniae NOT DETECTED NOT DETECTED Final   Streptococcus pyogenes NOT DETECTED NOT DETECTED Final   Acinetobacter baumannii NOT DETECTED NOT DETECTED Final   Enterobacteriaceae species NOT DETECTED NOT DETECTED Final   Enterobacter cloacae complex NOT DETECTED NOT DETECTED Final   Escherichia coli NOT DETECTED NOT DETECTED Final   Klebsiella oxytoca NOT DETECTED NOT DETECTED Final   Klebsiella pneumoniae NOT DETECTED NOT DETECTED Final   Proteus species NOT DETECTED NOT DETECTED Final   Serratia marcescens NOT DETECTED NOT DETECTED Final   Haemophilus influenzae NOT DETECTED NOT DETECTED Final   Neisseria meningitidis  NOT DETECTED NOT DETECTED Final   Pseudomonas aeruginosa NOT DETECTED NOT DETECTED Final   Candida albicans NOT DETECTED NOT DETECTED Final   Candida glabrata NOT DETECTED NOT DETECTED Final   Candida krusei NOT DETECTED NOT DETECTED Final   Candida  parapsilosis NOT DETECTED NOT DETECTED Final   Candida tropicalis NOT DETECTED NOT DETECTED Final  Culture, Urine     Status: Abnormal (Preliminary result)   Collection Time: 07/23/16  3:05 PM  Result Value Ref Range Status   Specimen Description URINE, RANDOM  Final   Special Requests NONE  Final   Culture (A)  Final    >=100,000 COLONIES/mL KLEBSIELLA PNEUMONIAE SUSCEPTIBILITIES TO FOLLOW    Report Status PENDING  Incomplete    Coagulation Studies: No results for input(s): LABPROT, INR in the last 72 hours.  Urinalysis:  Recent Labs  07/23/16 1505  COLORURINE AMBER*  LABSPEC 1.009  PHURINE 7.0  GLUCOSEU >=500*  HGBUR MODERATE*  BILIRUBINUR NEGATIVE  KETONESUR NEGATIVE  PROTEINUR 100*  NITRITE NEGATIVE  LEUKOCYTESUR LARGE*      Imaging: Dg Chest Port 1 View  Result Date: 07/23/2016 CLINICAL DATA:  Respiratory failure EXAM: PORTABLE CHEST 1 VIEW COMPARISON:  07/14/2016 FINDINGS: Severe diffuse bilateral airspace disease with low lung volumes. No significant change since prior study. Cardiomegaly. Right dialysis catheter and left PICC line are unchanged. IMPRESSION: Severe diffuse bilateral airspace disease with cardiomegaly and low lung volumes. No real change. Electronically Signed   By: Rolm Baptise M.D.   On: 07/23/2016 12:57     Medications:       Assessment/ Plan:  71 y.o.  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   1. Acute Renal failure / Solitary rt Pelvic Kidney 2. CKD st 3. Baseline Cr 1.27 (d/c from [facility]) Jun 29, 2016 3. B/L Pleural effusions and Acute respiratory failure requiring ventilator support 4. Anasarca/Generalized edema - Low albumin  - 3rd spacing of  fluid  5. Fever/Bacteremia. 6. Anemia of CKD. PRBC transfusion on 2/2  Plan:  Unfortunately the patient has developed bacteremia now. We will need to discontinue temporaryright internal jugular dialysis catheter. We will try to keep him catheter free for 2-3 days and then reassess for replacement. Overall however patient remains quite ill. He has metastatic disease as well as history of MAC and acute renal failure. His overall prognosis remains quite guarded. We will continue supportive care for now.  LOS: 0 Takeila Thayne 2/9/20182:58 PM

## 2016-07-25 LAB — URINE CULTURE

## 2016-07-25 LAB — CULTURE, BLOOD (ROUTINE X 2)

## 2016-07-26 LAB — CULTURE, RESPIRATORY W GRAM STAIN

## 2016-07-26 LAB — CULTURE, RESPIRATORY

## 2016-07-26 NOTE — Progress Notes (Signed)
IR aware of request for G-tube placement Chart reviewed.  CT scan done shows high stomach, but should be amenable to percutaneous approach G-tube. However, review of chart finds MRSA + bacteremia and Klebsiella UTI. Will need to have (-) repeat Urine and Blood Cx prior to placement of G-tube. IR will follow along and plan accordingly   Brayton ElKevin Raevon Broom PA-C Interventional Radiology 07/26/2016 7:50 AM

## 2016-07-27 ENCOUNTER — Other Ambulatory Visit (HOSPITAL_COMMUNITY): Payer: Federal, State, Local not specified - PPO

## 2016-07-27 ENCOUNTER — Encounter (HOSPITAL_COMMUNITY): Payer: Self-pay | Admitting: Interventional Radiology

## 2016-07-27 HISTORY — PX: IR GENERIC HISTORICAL: IMG1180011

## 2016-07-27 LAB — RENAL FUNCTION PANEL
ALBUMIN: 2 g/dL — AB (ref 3.5–5.0)
Anion gap: 15 (ref 5–15)
BUN: 90 mg/dL — AB (ref 6–20)
CO2: 23 mmol/L (ref 22–32)
CREATININE: 6.2 mg/dL — AB (ref 0.61–1.24)
Calcium: 7.8 mg/dL — ABNORMAL LOW (ref 8.9–10.3)
Chloride: 95 mmol/L — ABNORMAL LOW (ref 101–111)
GFR calc Af Amer: 9 mL/min — ABNORMAL LOW (ref >60)
GFR calc non Af Amer: 8 mL/min — ABNORMAL LOW (ref >60)
GLUCOSE: 159 mg/dL — AB (ref 65–99)
PHOSPHORUS: 8 mg/dL — AB (ref 2.5–4.6)
POTASSIUM: 4.8 mmol/L (ref 3.5–5.1)
Sodium: 133 mmol/L — ABNORMAL LOW (ref 135–145)

## 2016-07-27 LAB — URINALYSIS, ROUTINE W REFLEX MICROSCOPIC
BILIRUBIN URINE: NEGATIVE
GLUCOSE, UA: NEGATIVE mg/dL
Ketones, ur: NEGATIVE mg/dL
NITRITE: NEGATIVE
PH: 7 (ref 5.0–8.0)
Protein, ur: 100 mg/dL — AB
SPECIFIC GRAVITY, URINE: 1.013 (ref 1.005–1.030)
Squamous Epithelial / LPF: NONE SEEN

## 2016-07-27 LAB — CBC
HEMATOCRIT: 24.4 % — AB (ref 39.0–52.0)
Hemoglobin: 7.8 g/dL — ABNORMAL LOW (ref 13.0–17.0)
MCH: 28 pg (ref 26.0–34.0)
MCHC: 32 g/dL (ref 30.0–36.0)
MCV: 87.5 fL (ref 78.0–100.0)
PLATELETS: 191 10*3/uL (ref 150–400)
RBC: 2.79 MIL/uL — ABNORMAL LOW (ref 4.22–5.81)
RDW: 15.5 % (ref 11.5–15.5)
WBC: 7.5 10*3/uL (ref 4.0–10.5)

## 2016-07-27 LAB — MAGNESIUM: Magnesium: 2.6 mg/dL — ABNORMAL HIGH (ref 1.7–2.4)

## 2016-07-27 MED ORDER — HEPARIN SODIUM (PORCINE) 1000 UNIT/ML IJ SOLN
INTRAMUSCULAR | Status: AC
  Filled 2016-07-27: qty 1

## 2016-07-27 MED ORDER — LIDOCAINE-EPINEPHRINE (PF) 1 %-1:200000 IJ SOLN
INTRAMUSCULAR | Status: AC
  Filled 2016-07-27: qty 30

## 2016-07-27 NOTE — Progress Notes (Signed)
[facility]  ROUNDING NOTE   Subjective:  Right internal jugular temporary dose his catheter replaced today. BUN and creatinine remain quite high at 96.20 respectively. Wife at bedside.   Objective:  Vital signs in last 24 hours:  Temperature 96.6 pulse 96 respirations 19 blood pressure 153/76  Physical Exam: General: Critically ill appearing  Head: NCAT OM moist  Eyes: Eyes open  Neck: Supple  Lungs:  Coarse rhonchi, normal work of breathing  Heart: S1S2 no rubs, irregular  Abdomen:  Soft, nontender, bowel sound spresent  Extremities: Right BKA, 1+ LLE edema  Neurologic: Awake, does follow simple commands  Skin: No acute rashes  Access: R IJ temporary dialysis catheter replaced 8/78/67    Basic Metabolic Panel:  Recent Labs Lab 07/22/16 0637 07/23/16 0647 07/27/16 0644  NA 135 133* 133*  K 4.1 3.8 4.8  CL 96* 94* 95*  CO2 24 26 23   GLUCOSE 129* 122* 159*  BUN 109* 75* 90*  CREATININE 5.14* 4.31* 6.20*  CALCIUM 7.5* 7.5* 7.8*  MG  --   --  2.6*  PHOS 8.5* 6.9* 8.0*    Liver Function Tests:  Recent Labs Lab 07/22/16 0637 07/23/16 0647 07/27/16 0644  ALBUMIN 1.9* 1.9* 2.0*   No results for input(s): LIPASE, AMYLASE in the last 168 hours. No results for input(s): AMMONIA in the last 168 hours.  CBC:  Recent Labs Lab 07/22/16 0638 07/23/16 0647 07/27/16 0644  WBC 11.0* 10.8* 7.5  HGB 8.3* 8.1* 7.8*  HCT 26.0* 25.5* 24.4*  MCV 87.0 86.4 87.5  PLT 301 267 191    Cardiac Enzymes: No results for input(s): CKTOTAL, CKMB, CKMBINDEX, TROPONINI in the last 168 hours.  BNP: Invalid input(s): POCBNP  CBG: No results for input(s): GLUCAP in the last 168 hours.  Microbiology: Results for orders placed or performed during the hospital encounter of 06/29/16  Culture, blood (routine x 2)     Status: None   Collection Time: 06/29/16  6:12 PM  Result Value Ref Range Status   Specimen Description BLOOD LEFT HAND  Final   Special Requests  IN PEDIATRIC BOTTLE [place]  Final   Culture NO GROWTH 5 DAYS  Final   Report Status 07/04/2016 FINAL  Final  Culture, blood (routine x 2)     Status: None   Collection Time: 06/29/16  6:23 PM  Result Value Ref Range Status   Specimen Description BLOOD RIGHT HAND  Final   Special Requests BOTTLES DRAWN AEROBIC AND ANAEROBIC 5CC  Final   Culture NO GROWTH 5 DAYS  Final   Report Status 07/04/2016 FINAL  Final  Urine culture     Status: None   Collection Time: 06/30/16  7:39 AM  Result Value Ref Range Status   Specimen Description URINE, RANDOM  Final   Special Requests NONE  Final   Culture NO GROWTH  Final   Report Status 07/01/2016 FINAL  Final  Culture, blood (routine x 2)     Status: None   Collection Time: 07/04/16  2:58 PM  Result Value Ref Range Status   Specimen Description BLOOD LEFT HAND  Final   Special Requests BOTTLES DRAWN AEROBIC ONLY 10CC  Final   Culture NO GROWTH 5 DAYS  Final   Report Status 07/09/2016 FINAL  Final  Culture, blood (routine x 2)     Status: None   Collection Time: 07/04/16  3:02 PM  Result Value Ref Range Status   Specimen Description BLOOD LEFT HAND  Final  Special Requests BOTTLES DRAWN AEROBIC ONLY 10CC  Final   Culture NO GROWTH 5 DAYS  Final   Report Status 07/09/2016 FINAL  Final  Culture, respiratory (NON-Expectorated)     Status: None   Collection Time: 07/04/16  4:03 PM  Result Value Ref Range Status   Specimen Description TRACHEAL ASPIRATE  Final   Special Requests NONE  Final   Gram Stain   Final    ABUNDANT WBC PRESENT, PREDOMINANTLY PMN RARE GRAM NEGATIVE RODS RARE YEAST    Culture FEW KLEBSIELLA OXYTOCA  Final   Report Status 07/06/2016 FINAL  Final   Organism ID, Bacteria KLEBSIELLA OXYTOCA  Final      Susceptibility   Klebsiella oxytoca - MIC*    AMPICILLIN >=32 RESISTANT Resistant     CEFAZOLIN <=4 SENSITIVE Sensitive     CEFEPIME <=1 SENSITIVE Sensitive     CEFTAZIDIME <=1 SENSITIVE Sensitive     CEFTRIAXONE <=1  SENSITIVE Sensitive     CIPROFLOXACIN <=0.25 SENSITIVE Sensitive     GENTAMICIN <=1 SENSITIVE Sensitive     IMIPENEM <=0.25 SENSITIVE Sensitive     TRIMETH/SULFA <=20 SENSITIVE Sensitive     AMPICILLIN/SULBACTAM 8 SENSITIVE Sensitive     PIP/TAZO <=4 SENSITIVE Sensitive     Extended ESBL NEGATIVE Sensitive     * FEW KLEBSIELLA OXYTOCA  Culture, Urine     Status: Abnormal   Collection Time: 07/04/16  5:55 PM  Result Value Ref Range Status   Specimen Description URINE, RANDOM  Final   Special Requests NONE  Final   Culture >=100,000 COLONIES/mL YEAST (A)  Final   Report Status 07/05/2016 FINAL  Final  Culture, respiratory (NON-Expectorated)     Status: None   Collection Time: 07/09/16  2:26 PM  Result Value Ref Range Status   Specimen Description TRACHEAL ASPIRATE  Final   Special Requests NONE  Final   Gram Stain   Final    MODERATE WBC PRESENT, PREDOMINANTLY PMN NO ORGANISMS SEEN    Culture   Final    ABUNDANT PSEUDOMONAS AERUGINOSA MODERATE KLEBSIELLA OXYTOCA    Report Status 07/13/2016 FINAL  Final   Organism ID, Bacteria PSEUDOMONAS AERUGINOSA  Final   Organism ID, Bacteria KLEBSIELLA OXYTOCA  Final      Susceptibility   Klebsiella oxytoca - MIC*    AMPICILLIN >=32 RESISTANT Resistant     CEFAZOLIN <=4 SENSITIVE Sensitive     CEFEPIME <=1 SENSITIVE Sensitive     CEFTAZIDIME <=1 SENSITIVE Sensitive     CEFTRIAXONE <=1 SENSITIVE Sensitive     CIPROFLOXACIN <=0.25 SENSITIVE Sensitive     GENTAMICIN <=1 SENSITIVE Sensitive     IMIPENEM <=0.25 SENSITIVE Sensitive     TRIMETH/SULFA <=20 SENSITIVE Sensitive     AMPICILLIN/SULBACTAM 8 SENSITIVE Sensitive     PIP/TAZO <=4 SENSITIVE Sensitive     Extended ESBL NEGATIVE Sensitive     * MODERATE KLEBSIELLA OXYTOCA   Pseudomonas aeruginosa - MIC*    CEFTAZIDIME 4 SENSITIVE Sensitive     CIPROFLOXACIN 0.5 SENSITIVE Sensitive     GENTAMICIN <=1 SENSITIVE Sensitive     IMIPENEM 2 SENSITIVE Sensitive     PIP/TAZO 16 SENSITIVE  Sensitive     CEFEPIME 4 SENSITIVE Sensitive     * ABUNDANT PSEUDOMONAS AERUGINOSA  Culture, expectorated sputum-assessment     Status: None   Collection Time: 07/23/16 12:26 PM  Result Value Ref Range Status   Specimen Description EXPECTORATED SPUTUM  Final   Special Requests NONE  Final  Sputum evaluation THIS SPECIMEN IS ACCEPTABLE FOR SPUTUM CULTURE  Final   Report Status 07/23/2016 FINAL  Final  Culture, respiratory (NON-Expectorated)     Status: None   Collection Time: 07/23/16 12:26 PM  Result Value Ref Range Status   Specimen Description EXPECTORATED SPUTUM  Final   Special Requests NONE Reflexed from [id number]  Final   Gram Stain   Final    ABUNDANT WBC PRESENT, PREDOMINANTLY PMN FEW SQUAMOUS EPITHELIAL CELLS PRESENT FEW GRAM POSITIVE COCCI IN PAIRS RARE GRAM NEGATIVE COCCOBACILLI    Culture FEW PSEUDOMONAS AERUGINOSA  Final   Report Status 07/26/2016 FINAL  Final   Organism ID, Bacteria PSEUDOMONAS AERUGINOSA  Final      Susceptibility   Pseudomonas aeruginosa - MIC*    CEFTAZIDIME <=1 SENSITIVE Sensitive     CIPROFLOXACIN 2 INTERMEDIATE Intermediate     GENTAMICIN <=1 SENSITIVE Sensitive     IMIPENEM 8 INTERMEDIATE Intermediate     PIP/TAZO <=4 SENSITIVE Sensitive     CEFEPIME <=1 SENSITIVE Sensitive     * FEW PSEUDOMONAS AERUGINOSA  Culture, blood (routine x 2)     Status: None (Preliminary result)   Collection Time: 07/23/16  2:50 PM  Result Value Ref Range Status   Specimen Description BLOOD RIGHT HAND  Final   Special Requests IN PEDIATRIC BOTTLE 2.5CC  Final   Culture NO GROWTH 4 DAYS  Final   Report Status PENDING  Incomplete  Culture, blood (routine x 2)     Status: Abnormal   Collection Time: 07/23/16  2:55 PM  Result Value Ref Range Status   Specimen Description BLOOD LEFT HAND  Final   Special Requests IN PEDIATRIC BOTTLE 2.5CC  Final   Culture  Setup Time   Final    GRAM POSITIVE COCCI IN CLUSTERS IN PEDIATRIC BOTTLE Organism ID to follow CRITICAL  RESULT CALLED TO, READ BACK BY AND VERIFIED WITH: Ezzard Standing RN 14:55 07/24/16  ([email or web address])    Culture (A)  Final    STAPHYLOCOCCUS SPECIES (COAGULASE NEGATIVE) THE SIGNIFICANCE OF ISOLATING THIS ORGANISM FROM A SINGLE SET OF BLOOD CULTURES WHEN MULTIPLE SETS ARE DRAWN IS UNCERTAIN. PLEASE NOTIFY THE MICROBIOLOGY DEPARTMENT WITHIN ONE WEEK IF SPECIATION AND SENSITIVITIES ARE REQUIRED.    Report Status 07/25/2016 FINAL  Final  Blood Culture ID Panel (Reflexed)     Status: Abnormal   Collection Time: 07/23/16  2:55 PM  Result Value Ref Range Status   Enterococcus species NOT DETECTED NOT DETECTED Final   Listeria monocytogenes NOT DETECTED NOT DETECTED Final   Staphylococcus species DETECTED (A) NOT DETECTED Final    Comment: Methicillin (oxacillin) resistant coagulase negative staphylococcus. Possible blood culture contaminant (unless isolated from more than one blood culture draw or clinical case suggests pathogenicity). No antibiotic treatment is indicated for blood  culture contaminants. CRITICAL RESULT CALLED TO, READ BACK BY AND VERIFIED WITH: Ezzard Standing RN 14:55 07/24/16 ([email or web address])    Staphylococcus aureus NOT DETECTED NOT DETECTED Final   Methicillin resistance DETECTED (A) NOT DETECTED Final    Comment: CRITICAL RESULT CALLED TO, READ BACK BY AND VERIFIED WITH: Ezzard Standing RN 14:55 07/24/16 ([email or web address])    Streptococcus species NOT DETECTED NOT DETECTED Final   Streptococcus agalactiae NOT DETECTED NOT DETECTED Final   Streptococcus pneumoniae NOT DETECTED NOT DETECTED Final   Streptococcus pyogenes NOT DETECTED NOT DETECTED Final   Acinetobacter baumannii NOT DETECTED NOT DETECTED Final   Enterobacteriaceae species NOT DETECTED NOT DETECTED Final   Enterobacter cloacae complex NOT DETECTED NOT DETECTED  Final   Escherichia coli NOT DETECTED NOT DETECTED Final   Klebsiella oxytoca NOT DETECTED NOT DETECTED Final   Klebsiella pneumoniae NOT DETECTED NOT DETECTED Final   Proteus species NOT  DETECTED NOT DETECTED Final   Serratia marcescens NOT DETECTED NOT DETECTED Final   Haemophilus influenzae NOT DETECTED NOT DETECTED Final   Neisseria meningitidis NOT DETECTED NOT DETECTED Final   Pseudomonas aeruginosa NOT DETECTED NOT DETECTED Final   Candida albicans NOT DETECTED NOT DETECTED Final   Candida glabrata NOT DETECTED NOT DETECTED Final   Candida krusei NOT DETECTED NOT DETECTED Final   Candida parapsilosis NOT DETECTED NOT DETECTED Final   Candida tropicalis NOT DETECTED NOT DETECTED Final  Culture, Urine     Status: Abnormal   Collection Time: 07/23/16  3:05 PM  Result Value Ref Range Status   Specimen Description URINE, RANDOM  Final   Special Requests NONE  Final   Culture >=100,000 COLONIES/mL KLEBSIELLA PNEUMONIAE (A)  Final   Report Status 07/25/2016 FINAL  Final   Organism ID, Bacteria KLEBSIELLA PNEUMONIAE (A)  Final      Susceptibility   Klebsiella pneumoniae - MIC*    AMPICILLIN >=32 RESISTANT Resistant     CEFAZOLIN <=4 SENSITIVE Sensitive     CEFTRIAXONE <=1 SENSITIVE Sensitive     CIPROFLOXACIN >=4 RESISTANT Resistant     GENTAMICIN <=1 SENSITIVE Sensitive     IMIPENEM <=0.25 SENSITIVE Sensitive     NITROFURANTOIN 128 RESISTANT Resistant     TRIMETH/SULFA <=20 SENSITIVE Sensitive     AMPICILLIN/SULBACTAM 16 INTERMEDIATE Intermediate     PIP/TAZO 16 SENSITIVE Sensitive     Extended ESBL NEGATIVE Sensitive     * >=100,000 COLONIES/mL KLEBSIELLA PNEUMONIAE  Culture, blood (routine x 2)     Status: None (Preliminary result)   Collection Time: 07/26/16 12:23 PM  Result Value Ref Range Status   Specimen Description BLOOD RIGHT HAND  Final   Special Requests AEROBIC BOTTLE ONLY 6CC  Final   Culture NO GROWTH < 24 HOURS  Final   Report Status PENDING  Incomplete  Culture, blood (routine x 2)     Status: None (Preliminary result)   Collection Time: 07/26/16 12:23 PM  Result Value Ref Range Status   Specimen Description ARM RIGHT  Final   Special  Requests AEROBIC BOTTLE ONLY 5CC  Final   Culture NO GROWTH < 24 HOURS  Final   Report Status PENDING  Incomplete  Culture, Urine     Status: None (Preliminary result)   Collection Time: 07/27/16  4:11 AM  Result Value Ref Range Status   Specimen Description URINE, CATHETERIZED  Final   Special Requests NONE  Final   Culture PENDING  Incomplete   Report Status PENDING  Incomplete    Coagulation Studies: No results for input(s): LABPROT, INR in the last 72 hours.  Urinalysis:  Recent Labs  07/27/16 0413  COLORURINE AMBER*  LABSPEC 1.013  PHURINE 7.0  GLUCOSEU NEGATIVE  HGBUR SMALL*  BILIRUBINUR NEGATIVE  KETONESUR NEGATIVE  PROTEINUR 100*  NITRITE NEGATIVE  LEUKOCYTESUR LARGE*      Imaging: Ir Fluoro Guide Cv Line Right  Result Date: 07/27/2016 INDICATION: End-stage renal disease. Please perform temporary dialysis catheter placement for the continuation of dialysis in the setting of multifocal infection. EXAM: NON-TUNNELED CENTRAL VENOUS HEMODIALYSIS CATHETER PLACEMENT WITH ULTRASOUND AND FLUOROSCOPIC GUIDANCE COMPARISON:  Chest CT - 07/16/2015 MEDICATIONS: None FLUOROSCOPY TIME:  24 seconds (7 mGy) COMPLICATIONS: None immediate. PROCEDURE: Informed written consent was obtained from  the patient after a discussion of the risks, benefits, and alternatives to treatment. Questions regarding the procedure were encouraged and answered. The right neck and chest were prepped with chlorhexidine in a sterile fashion, and a sterile drape was applied covering the operative field. Maximum barrier sterile technique with sterile gowns and gloves were used for the procedure. A timeout was performed prior to the initiation of the procedure. After creating a small venotomy incision, a micropuncture kit was utilized to access the right internal jugular vein under direct, real-time ultrasound guidance after the overlying soft tissues were anesthetized with 1% lidocaine with epinephrine. Ultrasound  image documentation was performed. The microwire was kinked to measure appropriate catheter length. A stiff glidewire was advanced to the level of the IVC. Under fluoroscopic guidance, the venotomy was serially dilated, ultimately allowing placement of a 20 cm temporary Trialysis catheter with tip ultimately terminating within the superior aspect of the right atrium. Final catheter positioning was confirmed and documented with a spot radiographic image. The catheter aspirates and flushes normally. The catheter was flushed with appropriate volume heparin dwells. The catheter exit site was secured with a 0-Prolene retention suture. A dressing was placed. The patient tolerated the procedure well without immediate post procedural complication. IMPRESSION: Successful placement of a right internal jugular approach 20 cm temporary dialysis catheter with tip terminating with in the superior aspect of the right atrium. The catheter is ready for immediate use. PLAN: This catheter may be converted to a tunneled dialysis catheter at a later date as indicated. Electronically Signed   By: Sandi Mariscal M.D.   On: 07/27/2016 15:02   Ir US Guide Vasc Access Right  Result Date: 07/27/2016 INDICATION: End-stage renal disease. Please perform temporary dialysis catheter placement for the continuation of dialysis in the setting of multifocal infection. EXAM: NON-TUNNELED CENTRAL VENOUS HEMODIALYSIS CATHETER PLACEMENT WITH ULTRASOUND AND FLUOROSCOPIC GUIDANCE COMPARISON:  Chest CT - 07/16/2015 MEDICATIONS: None FLUOROSCOPY TIME:  24 seconds (7 mGy) COMPLICATIONS: None immediate. PROCEDURE: Informed written consent was obtained from the patient after a discussion of the risks, benefits, and alternatives to treatment. Questions regarding the procedure were encouraged and answered. The right neck and chest were prepped with chlorhexidine in a sterile fashion, and a sterile drape was applied covering the operative field. Maximum barrier  sterile technique with sterile gowns and gloves were used for the procedure. A timeout was performed prior to the initiation of the procedure. After creating a small venotomy incision, a micropuncture kit was utilized to access the right internal jugular vein under direct, real-time ultrasound guidance after the overlying soft tissues were anesthetized with 1% lidocaine with epinephrine. Ultrasound image documentation was performed. The microwire was kinked to measure appropriate catheter length. A stiff glidewire was advanced to the level of the IVC. Under fluoroscopic guidance, the venotomy was serially dilated, ultimately allowing placement of a 20 cm temporary Trialysis catheter with tip ultimately terminating within the superior aspect of the right atrium. Final catheter positioning was confirmed and documented with a spot radiographic image. The catheter aspirates and flushes normally. The catheter was flushed with appropriate volume heparin dwells. The catheter exit site was secured with a 0-Prolene retention suture. A dressing was placed. The patient tolerated the procedure well without immediate post procedural complication. IMPRESSION: Successful placement of a right internal jugular approach 20 cm temporary dialysis catheter with tip terminating with in the superior aspect of the right atrium. The catheter is ready for immediate use. PLAN: This catheter may be converted to  a tunneled dialysis catheter at a later date as indicated. Electronically Signed   By: Sandi Mariscal M.D.   On: 07/27/2016 15:02     Medications:       Assessment/ Plan:  72 y.o.  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   1. Acute Renal  failure / Solitary rt Pelvic Kidney 2. CKD st 3. Baseline Cr 1.27 (d/c from [facility]) Jun 29, 2016 3. B/L Pleural effusions and Acute respiratory failure requiring ventilator support 4. Anasarca/Generalized edema - Low albumin  - 3rd spacing of fluid  5. Fever/Bacteremia. 6. Anemia of CKD. PRBC transfusion on 2/2  Plan:  Overall the patient remains critically ill. He has respiratory failure, suspected malignancy, history of MAC infection, and acute renal failure. Given his overall prognosis remains quite guarded. Very keen to have a meeting with the family. For now we will maintain the patient on dialysis until this position is further discussed. He will be undergoing dialysis today. The temporary dialysis catheter has been replaced. It appears that coagulase-negativestaphylococcus was growing on the prior culture.  LOS: 0 Ichael Pullara 2/12/20186:09 PM

## 2016-07-27 NOTE — Procedures (Signed)
Pre-procedure Diagnosis: ESRD Post-procedure Diagnosis: Same  Successful placement of a non-tunneled HD catheter with tips terminating within the superior aspect of the right atrium.    Complications: None Immediate  EBL: Minimal   The catheter is ready for immediate use.   Jay Willian Donson, MD Pager #: [phone]   

## 2016-07-28 LAB — URINE CULTURE

## 2016-07-28 LAB — CULTURE, BLOOD (ROUTINE X 2): CULTURE: NO GROWTH

## 2016-07-29 LAB — RENAL FUNCTION PANEL
ANION GAP: 14 (ref 5–15)
Albumin: 2 g/dL — ABNORMAL LOW (ref 3.5–5.0)
BUN: 76 mg/dL — ABNORMAL HIGH (ref 6–20)
CHLORIDE: 95 mmol/L — AB (ref 101–111)
CO2: 23 mmol/L (ref 22–32)
CREATININE: 5.6 mg/dL — AB (ref 0.61–1.24)
Calcium: 7.8 mg/dL — ABNORMAL LOW (ref 8.9–10.3)
GFR, EST AFRICAN AMERICAN: 11 mL/min — AB (ref >60)
GFR, EST NON AFRICAN AMERICAN: 9 mL/min — AB (ref >60)
Glucose, Bld: 220 mg/dL — ABNORMAL HIGH (ref 65–99)
POTASSIUM: 4.3 mmol/L (ref 3.5–5.1)
Phosphorus: 7.2 mg/dL — ABNORMAL HIGH (ref 2.5–4.6)
Sodium: 132 mmol/L — ABNORMAL LOW (ref 135–145)

## 2016-07-29 LAB — CBC
HEMATOCRIT: 23.9 % — AB (ref 39.0–52.0)
Hemoglobin: 7.8 g/dL — ABNORMAL LOW (ref 13.0–17.0)
MCH: 28.4 pg (ref 26.0–34.0)
MCHC: 32.6 g/dL (ref 30.0–36.0)
MCV: 86.9 fL (ref 78.0–100.0)
PLATELETS: 166 10*3/uL (ref 150–400)
RBC: 2.75 MIL/uL — AB (ref 4.22–5.81)
RDW: 15.7 % — ABNORMAL HIGH (ref 11.5–15.5)
WBC: 6.3 10*3/uL (ref 4.0–10.5)

## 2016-07-29 LAB — URINALYSIS, COMPLETE (UACMP) WITH MICROSCOPIC
BACTERIA UA: NONE SEEN
BILIRUBIN URINE: NEGATIVE
Glucose, UA: NEGATIVE mg/dL
Ketones, ur: NEGATIVE mg/dL
NITRITE: NEGATIVE
Protein, ur: 100 mg/dL — AB
SPECIFIC GRAVITY, URINE: 1.014 (ref 1.005–1.030)
Squamous Epithelial / LPF: NONE SEEN
WBC, UA: NONE SEEN WBC/hpf (ref 0–5)
pH: 7 (ref 5.0–8.0)

## 2016-07-29 LAB — PROTIME-INR
INR: 1.22
PROTHROMBIN TIME: 15.4 s — AB (ref 11.4–15.2)

## 2016-07-29 NOTE — Progress Notes (Signed)
[facility]  ROUNDING NOTE   Subjective:  Patient seen and evaluated during hemodialysis. Observation cardiac reduced a bit given relative hypotension. Patient did receive albumin at the start of treatment.   Objective:  Vital signs in last 24 hours:  Temperature 96.5 pulse 79 respirations 20 blood pressure 149/75  Physical Exam: General: Critically ill appearing  Head: NCAT OM moist  Eyes: Eyes open  Neck: Supple  Lungs:  Coarse rhonchi, normal work of breathing  Heart: S1S2 no rubs, irregular  Abdomen:  Soft, nontender, bowel sound spresent  Extremities: Right BKA, 1+ LLE edema  Neurologic: Awake, does follow simple commands  Skin: No acute rashes  Access: R IJ temporary dialysis catheter replaced 2/91/91    Basic Metabolic Panel:  Recent Labs Lab 07/23/16 0647 07/27/16 0644 07/29/16 0538  NA 133* 133* 132*  K 3.8 4.8 4.3  CL 94* 95* 95*  CO2 _0 GLUCOSE 122* 159* 220*  BUN 75* 90* 76*  CREATININE 4.31* 6.20* 5.60*  CALCIUM 7.5* 7.8* 7.8*  MG  --  2.6*  --   PHOS 6.9* 8.0* 7.2*    Liver Function Tests:  Recent Labs Lab 07/23/16 0647 07/27/16 0644 07/29/16 0538  ALBUMIN 1.9* 2.0* 2.0*   No results for input(s): LIPASE, AMYLASE in the last 168 hours. No results for input(s): AMMONIA in the last 168 hours.  CBC:  Recent Labs Lab 07/23/16 0647 07/27/16 0644 07/29/16 0538  WBC 10.8* 7.5 6.3  HGB 8.1* 7.8* 7.8*  HCT 25.5* 24.4* 23.9*  MCV 86.4 87.5 86.9  PLT 267 191 166    Cardiac Enzymes: No results for input(s): CKTOTAL, CKMB, CKMBINDEX, TROPONINI in the last 168 hours.  BNP: Invalid input(s): POCBNP  CBG: No results for input(s): GLUCAP in the last 168 hours.  Microbiology: Results for orders placed or performed during the hospital encounter of 06/29/16  Culture, blood (routine x 2)     Status: None   Collection Time: 06/29/16  6:12 PM  Result Value Ref Range Status   Specimen Description BLOOD LEFT HAND  Final    Special Requests IN PEDIATRIC BOTTLE Mack  Final   Culture NO GROWTH 5 DAYS  Final   Report Status 07/04/2016 FINAL  Final  Culture, blood (routine x 2)     Status: None   Collection Time: 06/29/16  6:23 PM  Result Value Ref Range Status   Specimen Description BLOOD RIGHT HAND  Final   Special Requests BOTTLES DRAWN AEROBIC AND ANAEROBIC 5CC  Final   Culture NO GROWTH 5 DAYS  Final   Report Status 07/04/2016 FINAL  Final  Urine culture     Status: None   Collection Time: 06/30/16  7:39 AM  Result Value Ref Range Status   Specimen Description URINE, RANDOM  Final   Special Requests NONE  Final   Culture NO GROWTH  Final   Report Status 07/01/2016 FINAL  Final  Culture, blood (routine x 2)     Status: None   Collection Time: 07/04/16  2:58 PM  Result Value Ref Range Status   Specimen Description BLOOD LEFT HAND  Final   Special Requests BOTTLES DRAWN AEROBIC ONLY 10CC  Final   Culture NO GROWTH 5 DAYS  Final   Report Status 07/09/2016 FINAL  Final  Culture, blood (routine x 2)     Status: None   Collection Time: 07/04/16  3:02 PM  Result Value Ref Range Status   Specimen Description BLOOD LEFT HAND  Final   Special Requests BOTTLES DRAWN AEROBIC ONLY 10CC  Final   Culture NO GROWTH 5 DAYS  Final   Report Status 07/09/2016 FINAL  Final  Culture, respiratory (NON-Expectorated)     Status: None   Collection Time: 07/04/16  4:03 PM  Result Value Ref Range Status   Specimen Description TRACHEAL ASPIRATE  Final   Special Requests NONE  Final   Gram Stain   Final    ABUNDANT WBC PRESENT, PREDOMINANTLY PMN RARE GRAM NEGATIVE RODS RARE YEAST    Culture FEW KLEBSIELLA OXYTOCA  Final   Report Status 07/06/2016 FINAL  Final   Organism ID, Bacteria KLEBSIELLA OXYTOCA  Final      Susceptibility   Klebsiella oxytoca - MIC*    AMPICILLIN >=32 RESISTANT Resistant     CEFAZOLIN <=4 SENSITIVE Sensitive     CEFEPIME <=1 SENSITIVE Sensitive     CEFTAZIDIME <=1 SENSITIVE Sensitive      CEFTRIAXONE <=1 SENSITIVE Sensitive     CIPROFLOXACIN <=0.25 SENSITIVE Sensitive     GENTAMICIN <=1 SENSITIVE Sensitive     IMIPENEM <=0.25 SENSITIVE Sensitive     TRIMETH/SULFA <=20 SENSITIVE Sensitive     AMPICILLIN/SULBACTAM 8 SENSITIVE Sensitive     PIP/TAZO <=4 SENSITIVE Sensitive     Extended ESBL NEGATIVE Sensitive     * FEW KLEBSIELLA OXYTOCA  Culture, Urine     Status: Abnormal   Collection Time: 07/04/16  5:55 PM  Result Value Ref Range Status   Specimen Description URINE, RANDOM  Final   Special Requests NONE  Final   Culture >=100,000 COLONIES/mL YEAST (A)  Final   Report Status 07/05/2016 FINAL  Final  Culture, respiratory (NON-Expectorated)     Status: None   Collection Time: 07/09/16  2:26 PM  Result Value Ref Range Status   Specimen Description TRACHEAL ASPIRATE  Final   Special Requests NONE  Final   Gram Stain   Final    MODERATE WBC PRESENT, PREDOMINANTLY PMN NO ORGANISMS SEEN    Culture   Final    ABUNDANT PSEUDOMONAS AERUGINOSA MODERATE KLEBSIELLA OXYTOCA    Report Status 07/13/2016 FINAL  Final   Organism ID, Bacteria PSEUDOMONAS AERUGINOSA  Final   Organism ID, Bacteria KLEBSIELLA OXYTOCA  Final      Susceptibility   Klebsiella oxytoca - MIC*    AMPICILLIN >=32 RESISTANT Resistant     CEFAZOLIN <=4 SENSITIVE Sensitive     CEFEPIME <=1 SENSITIVE Sensitive     CEFTAZIDIME <=1 SENSITIVE Sensitive     CEFTRIAXONE <=1 SENSITIVE Sensitive     CIPROFLOXACIN <=0.25 SENSITIVE Sensitive     GENTAMICIN <=1 SENSITIVE Sensitive     IMIPENEM <=0.25 SENSITIVE Sensitive     TRIMETH/SULFA <=20 SENSITIVE Sensitive     AMPICILLIN/SULBACTAM 8 SENSITIVE Sensitive     PIP/TAZO <=4 SENSITIVE Sensitive     Extended ESBL NEGATIVE Sensitive     * MODERATE KLEBSIELLA OXYTOCA   Pseudomonas aeruginosa - MIC*    CEFTAZIDIME 4 SENSITIVE Sensitive     CIPROFLOXACIN 0.5 SENSITIVE Sensitive     GENTAMICIN <=1 SENSITIVE Sensitive     IMIPENEM 2 SENSITIVE Sensitive     PIP/TAZO  16 SENSITIVE Sensitive     CEFEPIME 4 SENSITIVE Sensitive     * ABUNDANT PSEUDOMONAS AERUGINOSA  Culture, expectorated sputum-assessment     Status: None   Collection Time: 07/23/16 12:26 PM  Result Value Ref Range Status   Specimen Description EXPECTORATED SPUTUM  Final   Special Requests NONE  Final   Sputum evaluation THIS SPECIMEN IS ACCEPTABLE FOR SPUTUM CULTURE  Final   Report Status 07/23/2016 FINAL  Final  Culture, respiratory (NON-Expectorated)     Status: None   Collection Time: 07/23/16 12:26 PM  Result Value Ref Range Status   Specimen Description EXPECTORATED SPUTUM  Final   Special Requests NONE Reflexed from [id number]  Final   Gram Stain   Final    ABUNDANT WBC PRESENT, PREDOMINANTLY PMN FEW SQUAMOUS EPITHELIAL CELLS PRESENT FEW GRAM POSITIVE COCCI IN PAIRS RARE GRAM NEGATIVE COCCOBACILLI    Culture FEW PSEUDOMONAS AERUGINOSA  Final   Report Status 07/26/2016 FINAL  Final   Organism ID, Bacteria PSEUDOMONAS AERUGINOSA  Final      Susceptibility   Pseudomonas aeruginosa - MIC*    CEFTAZIDIME <=1 SENSITIVE Sensitive     CIPROFLOXACIN 2 INTERMEDIATE Intermediate     GENTAMICIN <=1 SENSITIVE Sensitive     IMIPENEM 8 INTERMEDIATE Intermediate     PIP/TAZO <=4 SENSITIVE Sensitive     CEFEPIME <=1 SENSITIVE Sensitive     * FEW PSEUDOMONAS AERUGINOSA  Culture, blood (routine x 2)     Status: None   Collection Time: 07/23/16  2:50 PM  Result Value Ref Range Status   Specimen Description BLOOD RIGHT HAND  Final   Special Requests IN PEDIATRIC BOTTLE 2.5CC  Final   Culture NO GROWTH 5 DAYS  Final   Report Status 07/28/2016 FINAL  Final  Culture, blood (routine x 2)     Status: Abnormal   Collection Time: 07/23/16  2:55 PM  Result Value Ref Range Status   Specimen Description BLOOD LEFT HAND  Final   Special Requests IN PEDIATRIC BOTTLE 2.5CC  Final   Culture  Setup Time   Final    GRAM POSITIVE COCCI IN CLUSTERS IN PEDIATRIC BOTTLE Organism ID to follow CRITICAL  RESULT CALLED TO, READ BACK BY AND VERIFIED WITH: Ezzard Standing RN 14:55 07/24/16  ([email or web address])    Culture (A)  Final    STAPHYLOCOCCUS SPECIES (COAGULASE NEGATIVE) THE SIGNIFICANCE OF ISOLATING THIS ORGANISM FROM A SINGLE SET OF BLOOD CULTURES WHEN MULTIPLE SETS ARE DRAWN IS UNCERTAIN. PLEASE NOTIFY THE MICROBIOLOGY DEPARTMENT WITHIN ONE WEEK IF SPECIATION AND SENSITIVITIES ARE REQUIRED.    Report Status 07/25/2016 FINAL  Final  Blood Culture ID Panel (Reflexed)     Status: Abnormal   Collection Time: 07/23/16  2:55 PM  Result Value Ref Range Status   Enterococcus species NOT DETECTED NOT DETECTED Final   Listeria monocytogenes NOT DETECTED NOT DETECTED Final   Staphylococcus species DETECTED (A) NOT DETECTED Final    Comment: Methicillin (oxacillin) resistant coagulase negative staphylococcus. Possible blood culture contaminant (unless isolated from more than one blood culture draw or clinical case suggests pathogenicity). No antibiotic treatment is indicated for blood  culture contaminants. CRITICAL RESULT CALLED TO, READ BACK BY AND VERIFIED WITH: Ezzard Standing RN 14:55 07/24/16 ([email or web address])    Staphylococcus aureus NOT DETECTED NOT DETECTED Final   Methicillin resistance DETECTED (A) NOT DETECTED Final    Comment: CRITICAL RESULT CALLED TO, READ BACK BY AND VERIFIED WITH: Ezzard Standing RN 14:55 07/24/16 ([email or web address])    Streptococcus species NOT DETECTED NOT DETECTED Final   Streptococcus agalactiae NOT DETECTED NOT DETECTED Final   Streptococcus pneumoniae NOT DETECTED NOT DETECTED Final   Streptococcus pyogenes NOT DETECTED NOT DETECTED Final   Acinetobacter baumannii NOT DETECTED NOT DETECTED Final   Enterobacteriaceae species NOT DETECTED NOT DETECTED Final   Enterobacter cloacae complex NOT DETECTED  NOT DETECTED Final   Escherichia coli NOT DETECTED NOT DETECTED Final   Klebsiella oxytoca NOT DETECTED NOT DETECTED Final   Klebsiella pneumoniae NOT DETECTED NOT DETECTED Final   Proteus species NOT  DETECTED NOT DETECTED Final   Serratia marcescens NOT DETECTED NOT DETECTED Final   Haemophilus influenzae NOT DETECTED NOT DETECTED Final   Neisseria meningitidis NOT DETECTED NOT DETECTED Final   Pseudomonas aeruginosa NOT DETECTED NOT DETECTED Final   Candida albicans NOT DETECTED NOT DETECTED Final   Candida glabrata NOT DETECTED NOT DETECTED Final   Candida krusei NOT DETECTED NOT DETECTED Final   Candida parapsilosis NOT DETECTED NOT DETECTED Final   Candida tropicalis NOT DETECTED NOT DETECTED Final  Culture, Urine     Status: Abnormal   Collection Time: 07/23/16  3:05 PM  Result Value Ref Range Status   Specimen Description URINE, RANDOM  Final   Special Requests NONE  Final   Culture >=100,000 COLONIES/mL KLEBSIELLA PNEUMONIAE (A)  Final   Report Status 07/25/2016 FINAL  Final   Organism ID, Bacteria KLEBSIELLA PNEUMONIAE (A)  Final      Susceptibility   Klebsiella pneumoniae - MIC*    AMPICILLIN >=32 RESISTANT Resistant     CEFAZOLIN <=4 SENSITIVE Sensitive     CEFTRIAXONE <=1 SENSITIVE Sensitive     CIPROFLOXACIN >=4 RESISTANT Resistant     GENTAMICIN <=1 SENSITIVE Sensitive     IMIPENEM <=0.25 SENSITIVE Sensitive     NITROFURANTOIN 128 RESISTANT Resistant     TRIMETH/SULFA <=20 SENSITIVE Sensitive     AMPICILLIN/SULBACTAM 16 INTERMEDIATE Intermediate     PIP/TAZO 16 SENSITIVE Sensitive     Extended ESBL NEGATIVE Sensitive     * >=100,000 COLONIES/mL KLEBSIELLA PNEUMONIAE  Culture, blood (routine x 2)     Status: None (Preliminary result)   Collection Time: 07/26/16 12:23 PM  Result Value Ref Range Status   Specimen Description BLOOD RIGHT HAND  Final   Special Requests AEROBIC BOTTLE ONLY 6CC  Final   Culture NO GROWTH 3 DAYS  Final   Report Status PENDING  Incomplete  Culture, blood (routine x 2)     Status: None (Preliminary result)   Collection Time: 07/26/16 12:23 PM  Result Value Ref Range Status   Specimen Description ARM RIGHT  Final   Special Requests  AEROBIC BOTTLE ONLY 5CC  Final   Culture NO GROWTH 3 DAYS  Final   Report Status PENDING  Incomplete  Culture, Urine     Status: Abnormal   Collection Time: 07/27/16  4:11 AM  Result Value Ref Range Status   Specimen Description URINE, CATHETERIZED  Final   Special Requests NONE  Final   Culture MULTIPLE SPECIES PRESENT, SUGGEST RECOLLECTION (A)  Final   Report Status 07/28/2016 FINAL  Final    Coagulation Studies:  Recent Labs  07/29/16 1126  LABPROT 15.4*  INR 1.22    Urinalysis:  Recent Labs  07/27/16 0413 07/29/16 0445  COLORURINE AMBER* AMBER*  LABSPEC 1.013 1.014  PHURINE 7.0 7.0  GLUCOSEU NEGATIVE NEGATIVE  HGBUR SMALL* SMALL*  BILIRUBINUR NEGATIVE NEGATIVE  KETONESUR NEGATIVE NEGATIVE  PROTEINUR 100* 100*  NITRITE NEGATIVE NEGATIVE  LEUKOCYTESUR LARGE* MODERATE*      Imaging: No results found.   Medications:       Assessment/ Plan:  71 y.o.  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   1. Acute Renal failure / Solitary rt Pelvic Kidney 2. CKD st 3. Baseline Cr 1.27 (d/c from [facility]) Jun 29, 2016 3. B/L Pleural effusions and Acute respiratory failure requiring ventilator support 4. Anasarca/Generalized edema - Low albumin  - 3rd spacing of fluid  5. Fever/Bacteremia. 6. Anemia of CKD. PRBC transfusion on 2/2  Plan:  Overall renal function remains quite poor. Therefore he remains dialysis dependent at this time. Patient was seen and evaluated during emodialysis today. "Target has been cut down a bit.We will continue to use albumin during dialysis sessions to help support his blood pressure. He is also being treated for coagulase-negative bacteremia. Defer  management of this 2 hospitals. His bowels his catheter was previously changed out. Overall prognosis remains quite guarded.  LOS: 0 Taylor Maldonado 2/14/20183:[id number] PM

## 2016-07-29 NOTE — Consult Note (Signed)
Chief Complaint: Patient was seen in consultation today for percutaneous gastric tube placement at the request of Dr Mariah Milling  Referring Physician(s): Dr Mariah Milling  Supervising Physician: Daryll Brod  Patient Status: [facility] - In-pt                            [facility] History of Present Illness: Taylor Maldonado is a 71 y.o. 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 Result---from outside facility Required Bipap and transfer to [facility] for management  Now with Protein calorie malnutrition Slow healing wounds BC + 07/23/16 2/11: NGTD +UTI; now clear Dysphagia And need for long term care  Request for percutaneous gastric tube placement Dr Kathlene Cote has approved procedure Plan for poss 2/15 in IR Will rechck BC in am    No past medical history on file.  Past Surgical History:  Procedure Laterality Date  . IR GENERIC HISTORICAL  07/06/2016   IR FLUORO GUIDE CV LINE RIGHT 07/06/2016 [facility]  . IR GENERIC HISTORICAL  07/06/2016   IR US GUIDE VASC ACCESS RIGHT 07/06/2016 [facility]  . IR GENERIC HISTORICAL  07/27/2016   IR US GUIDE VASC ACCESS RIGHT 07/27/2016 Sandi Mariscal, MD [facility]  . IR GENERIC HISTORICAL  07/27/2016   IR FLUORO GUIDE CV LINE RIGHT 07/27/2016 Sandi Mariscal, MD [facility]    Allergies: Patient has no allergy information on record.  Medications: Prior to Admission medications   Not on File     No family history on file.  Social History   Social History  . Marital status: Unknown    Spouse name: N/A  . Number of children: N/A  . Years of education: N/A   Social History Main Topics  . Smoking status: Not on file  . Smokeless tobacco: Not on file  . Alcohol use Not on file  . Drug use: Unknown  . Sexual activity: Not on file   Other Topics Concern  . Not on file    Social History Narrative  . No narrative on file    Review of Systems: A 12 point ROS discussed and pertinent positives are indicated in the HPI above.  All other systems are negative.  Review of Systems  Constitutional: Positive for activity change, appetite change and fatigue. Negative for fever.  Respiratory: Negative for shortness of breath.   Gastrointestinal: Negative for abdominal pain.  Neurological: Positive for weakness.  Psychiatric/Behavioral: Negative for behavioral problems.    Vital Signs: BP (!) 146/82   Pulse (!) 110   Resp 17   SpO2 94%   Physical Exam  Cardiovascular: Normal rate and regular rhythm.   Pulmonary/Chest: Effort normal. He has wheezes.  Abdominal: Soft. Bowel sounds are normal.  Musculoskeletal:  Left foot partial amputation R BKA   Neurological: He is alert.  Tries to communicate Weak and lethargic  Skin: Skin is warm.  Psychiatric:  Consented with Son at bedside  Nursing note and vitals reviewed.   Mallampati Score:  MD Evaluation Airway: WNL Heart: WNL Abdomen: WNL Chest/ Lungs: WNL ASA  Classification: 3 Mallampati/Airway Score: Two  Imaging: Ct Abdomen Wo Contrast  Result Date: 07/25/2016 CLINICAL DATA:  Respiratory failure, renal failure and dysphasia. Assessment for possible placement of percutaneous gastrostomy tube. EXAM: CT ABDOMEN WITHOUT CONTRAST TECHNIQUE: Multidetector  CT imaging of the abdomen was performed following the standard protocol without IV contrast. COMPARISON:  Unenhanced CT of the abdomen at [facility] on 12/07/2003 FINDINGS: Lower chest: Visualized lung bases show bilateral pleural effusions and bibasilar atelectasis. Hepatobiliary: Unenhanced appearance of the liver is unremarkable. The gallbladder has been removed. No biliary obstruction identified. Pancreas: Unenhanced appearance of the pancreas is unremarkable. Spleen: Unenhanced appearance of the spleen is unremarkable. Adrenals/Urinary  Tract: Normal positioned right kidney noted containing several small nonobstructing renal calculi. Largest calculus measures 6 mm in the lower pole. 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 The bladder contains a Foley catheter and is decompressed. Rounded left-sided adrenal mass measures approximately 2.7 x 3.2 cm and demonstrates internal density of approximately 50 Hounsfield units. This previously measured approximately 1.8 x 2.3 cm in 2005 with internal density of approximately 8 Hounsfield units at that time. Based on current unenhanced internal density, this is not definitively a benign adenoma. Stomach/Bowel: A possible placement of percutaneous gastrostomy tube. Gastric decompression tube extends into the body of the stomach. The stomach is high in position but otherwise demonstrates normal anatomy relative to small bowel and colon and there should be no direct anatomic contraindication to attempted percutaneous gastrostomy. No ileus or bowel obstruction identified. No free air or abnormal fluid collections. Vascular/Lymphatic: No enlarged lymph nodes identified. Other: No hernias identified. The prostate gland appears likely to have been previously removed. Musculoskeletal: Visualized bony structures demonstrate diffuse sclerosis of nearly the entire T9 vertebral body and patchy areas of sclerosis involving T10, T11, L1, L2 and L4. IMPRESSION: 1. Bilateral pleural effusions. 2. Gastric anatomy is not prohibitive to attempted percutaneous gastrostomy tube placement. The stomach is high, but otherwise normally oriented relative to the colon and small bowel. 3. Enlarging left adrenal mass which was previously imaged in 2005. The mass is now larger and also of higher internal density. Density is no longer definitively benign and a collision lesion with development of malignancy cannot be  completely excluded by unenhanced CT. Depending on clinical condition and prognosis, further elective evaluation with dynamic contrast-enhanced CT or MRI may help to further investigate. 4. Abnormal sclerosis of nearly the entire T9 vertebral body and patchy sclerosis of T10, T11, L1, L2 and L4. Findings are concerning for potential sclerotic metastatic disease. It appears the patient is status post prostatectomy. Correlation suggested with any known history of previous treated metastatic prostate carcinoma and current PSA level. Electronically Signed   By: Aletta Edouard M.D.   On: 07/25/2016 09:00   Ct Chest Wo Contrast  Result Date: 07/15/2016 CLINICAL DATA:  Assess pulmonary infiltrates seen on chest radiograph. Initial encounter. EXAM: CT CHEST WITHOUT CONTRAST TECHNIQUE: Multidetector CT imaging of the chest was performed following the standard protocol without IV contrast. COMPARISON:  Chest radiograph performed 07/14/2016 FINDINGS: Cardiovascular: The heart is borderline normal in size. Scattered coronary artery calcifications are seen. Scattered calcification is noted along the aortic arch and descending thoracic aorta. The great vessels are grossly unremarkable, though difficult to fully assess without contrast. Mediastinum/Nodes: The patient's endotracheal tube is seen ending 4 cm above the carina. A 1.4 cm subcarinal node is seen. No additional mediastinal lymphadenopathy is appreciated. No pericardial effusion is seen. The thyroid gland is grossly unremarkable in appearance. A left PICC is noted ending about the mid SVC. A right IJ line is noted ending about the distal SVC. No axillary lymphadenopathy  is appreciated. Minimal bilateral gynecomastia is noted. Lungs/Pleura: Small bilateral pleural effusions are noted, left larger than right. Patchy bilateral airspace opacification, most prominent at the lung apices, raises concern for multifocal infection, though mild superimposed edema cannot be  excluded. No pneumothorax is seen. Given the metastatic bony disease described below, a 5.1 cm left basilar lung mass cannot be excluded. Upper Abdomen: The visualized portions of the liver and spleen are grossly unremarkable. The patient is status post cholecystectomy, with clips noted at the gallbladder fossa. The visualized portions of the pancreas and adrenal glands are within normal limits. Musculoskeletal: Multiple sclerotic lesions throughout the thoracic spine, involving nearly the entirety of vertebral body T9, though also at T3, T4, T7, T10 and T11, are concerning for metastatic disease. Cervical spinal fusion hardware is partially imaged. The visualized musculature is unremarkable in appearance. IMPRESSION: 1. Small bilateral pleural effusions, left larger than right. Patchy bilateral airspace opacification, most prominent at the lung apices, raises concern for multifocal infection, though mild superimposed pulmonary edema cannot be excluded. 2. Multiple sclerotic lesions throughout the thoracic spine, involving nearly the entirety vertebral body T9, though also seen at T3, T4, T7, T10 and T11, concerning for metastatic disease. 3. Given the metastatic bony disease, a 5.1 cm left basilar lung mass cannot be excluded. If no prior primary malignancy has been diagnosed, PET/CT could be considered for further evaluation, to assess for primary malignancy and extent of metastatic disease. 4. 1.4 cm subcarinal node noted. 5. Scattered coronary artery calcifications seen. Electronically Signed   By: Garald Balding M.D.   On: 07/15/2016 19:53   Ir Fluoro Guide Cv Line Right  Result Date: 07/27/2016 INDICATION: End-stage renal disease. Please perform temporary dialysis catheter placement for the continuation of dialysis in the setting of multifocal infection. EXAM: NON-TUNNELED CENTRAL VENOUS HEMODIALYSIS CATHETER PLACEMENT WITH ULTRASOUND AND FLUOROSCOPIC GUIDANCE COMPARISON:  Chest CT - 07/16/2015  MEDICATIONS: None FLUOROSCOPY TIME:  24 seconds (7 mGy) COMPLICATIONS: None immediate. PROCEDURE: Informed written consent was obtained from the patient after a discussion of the risks, benefits, and alternatives to treatment. Questions regarding the procedure were encouraged and answered. The right neck and chest were prepped with chlorhexidine in a sterile fashion, and a sterile drape was applied covering the operative field. Maximum barrier sterile technique with sterile gowns and gloves were used for the procedure. A timeout was performed prior to the initiation of the procedure. After creating a small venotomy incision, a micropuncture kit was utilized to access the right internal jugular vein under direct, real-time ultrasound guidance after the overlying soft tissues were anesthetized with 1% lidocaine with epinephrine. Ultrasound image documentation was performed. The microwire was kinked to measure appropriate catheter length. A stiff glidewire was advanced to the level of the IVC. Under fluoroscopic guidance, the venotomy was serially dilated, ultimately allowing placement of a 20 cm temporary Trialysis catheter with tip ultimately terminating within the superior aspect of the right atrium. Final catheter positioning was confirmed and documented with a spot radiographic image. The catheter aspirates and flushes normally. The catheter was flushed with appropriate volume heparin dwells. The catheter exit site was secured with a 0-Prolene retention suture. A dressing was placed. The patient tolerated the procedure well without immediate post procedural complication. IMPRESSION: Successful placement of a right internal jugular approach 20 cm temporary dialysis catheter with tip terminating with in the superior aspect of the right atrium. The catheter is ready for immediate use. PLAN: This catheter may be converted to a tunneled dialysis  catheter at a later date as indicated. Electronically Signed   By: Sandi Mariscal M.D.   On: 07/27/2016 15:02   Ir Fluoro Guide Cv Line Right  Result Date: 07/06/2016 INDICATION: 71 year old male in need of hemodialysis EXAM: IR RIGHT FLOURO GUIDE CV LINE; IR ULTRASOUND GUIDANCE VASC ACCESS RIGHT MEDICATIONS: None required ANESTHESIA/SEDATION: None required FLUOROSCOPY TIME:  Fluoroscopy Time: 0 minutes 18 seconds (6 mGy). COMPLICATIONS: None immediate. PROCEDURE: Informed written consent was obtained from the patient after a thorough discussion of the procedural risks, benefits and alternatives. All questions were addressed. Maximal Sterile Barrier Technique was utilized including caps, mask, sterile gowns, sterile gloves, sterile drape, hand hygiene and skin antiseptic. A timeout was performed prior to the initiation of the procedure. The right internal jugular vein was interrogated with ultrasound and found to be widely patent. An image was obtained and stored for the medical record. Local anesthesia was attained by infiltration with 1% lidocaine. A small dermatotomy was made. Under real-time sonographic guidance, the vessel was punctured with an 18 gauge needle. A 0.035 inch wire was then advanced through the superior vena cava, right atrium and into the inferior vena cava. The skin tract was dilated and a Mahurkar 20 cm non tunneled hemodialysis catheter with an additional central venous catheter port was advanced over the wire and position with the tip in the upper right atrium. The wire was removed. All 3 lumens of the catheter flushed and aspirated with ease. The catheter was flushed with heparinized saline and secured to the skin with 0 Prolene suture and a sterile bandage. IMPRESSION: Successful placement of a right IJ approach non tunneled hemodialysis catheter with an additional central venous access port. The catheter tips are in the upper right atrium and ready for immediate use. Electronically Signed   By: Jacqulynn Cadet M.D.   On: 07/06/2016 17:07   Ir US Guide Vasc  Access Right  Result Date: 07/27/2016 INDICATION: End-stage renal disease. Please perform temporary dialysis catheter placement for the continuation of dialysis in the setting of multifocal infection. EXAM: NON-TUNNELED CENTRAL VENOUS HEMODIALYSIS CATHETER PLACEMENT WITH ULTRASOUND AND FLUOROSCOPIC GUIDANCE COMPARISON:  Chest CT - 07/16/2015 MEDICATIONS: None FLUOROSCOPY TIME:  24 seconds (7 mGy) COMPLICATIONS: None immediate. PROCEDURE: Informed written consent was obtained from the patient after a discussion of the risks, benefits, and alternatives to treatment. Questions regarding the procedure were encouraged and answered. The right neck and chest were prepped with chlorhexidine in a sterile fashion, and a sterile drape was applied covering the operative field. Maximum barrier sterile technique with sterile gowns and gloves were used for the procedure. A timeout was performed prior to the initiation of the procedure. After creating a small venotomy incision, a micropuncture kit was utilized to access the right internal jugular vein under direct, real-time ultrasound guidance after the overlying soft tissues were anesthetized with 1% lidocaine with epinephrine. Ultrasound image documentation was performed. The microwire was kinked to measure appropriate catheter length. A stiff glidewire was advanced to the level of the IVC. Under fluoroscopic guidance, the venotomy was serially dilated, ultimately allowing placement of a 20 cm temporary Trialysis catheter with tip ultimately terminating within the superior aspect of the right atrium. Final catheter positioning was confirmed and documented with a spot radiographic image. The catheter aspirates and flushes normally. The catheter was flushed with appropriate volume heparin dwells. The catheter exit site was secured with a 0-Prolene retention suture. A dressing was placed. The patient tolerated the procedure well without immediate post  procedural complication.  IMPRESSION: Successful placement of a right internal jugular approach 20 cm temporary dialysis catheter with tip terminating with in the superior aspect of the right atrium. The catheter is ready for immediate use. PLAN: This catheter may be converted to a tunneled dialysis catheter at a later date as indicated. Electronically Signed   By: Sandi Mariscal M.D.   On: 07/27/2016 15:02   Ir US Guide Vasc Access Right  Result Date: 07/06/2016 INDICATION: 71 year old male in need of hemodialysis EXAM: IR RIGHT FLOURO GUIDE CV LINE; IR ULTRASOUND GUIDANCE VASC ACCESS RIGHT MEDICATIONS: None required ANESTHESIA/SEDATION: None required FLUOROSCOPY TIME:  Fluoroscopy Time: 0 minutes 18 seconds (6 mGy). COMPLICATIONS: None immediate. PROCEDURE: Informed written consent was obtained from the patient after a thorough discussion of the procedural risks, benefits and alternatives. All questions were addressed. Maximal Sterile Barrier Technique was utilized including caps, mask, sterile gowns, sterile gloves, sterile drape, hand hygiene and skin antiseptic. A timeout was performed prior to the initiation of the procedure. The right internal jugular vein was interrogated with ultrasound and found to be widely patent. An image was obtained and stored for the medical record. Local anesthesia was attained by infiltration with 1% lidocaine. A small dermatotomy was made. Under real-time sonographic guidance, the vessel was punctured with an 18 gauge needle. A 0.035 inch wire was then advanced through the superior vena cava, right atrium and into the inferior vena cava. The skin tract was dilated and a Mahurkar 20 cm non tunneled hemodialysis catheter with an additional central venous catheter port was advanced over the wire and position with the tip in the upper right atrium. The wire was removed. All 3 lumens of the catheter flushed and aspirated with ease. The catheter was flushed with heparinized saline and secured to the skin with 0  Prolene suture and a sterile bandage. IMPRESSION: Successful placement of a right IJ approach non tunneled hemodialysis catheter with an additional central venous access port. The catheter tips are in the upper right atrium and ready for immediate use. Electronically Signed   By: Jacqulynn Cadet M.D.   On: 07/06/2016 17:07   Dg Chest Port 1 View  Result Date: 07/23/2016 CLINICAL DATA:  Respiratory failure EXAM: PORTABLE CHEST 1 VIEW COMPARISON:  07/14/2016 FINDINGS: Severe diffuse bilateral airspace disease with low lung volumes. No significant change since prior study. Cardiomegaly. Right dialysis catheter and left PICC line are unchanged. IMPRESSION: Severe diffuse bilateral airspace disease with cardiomegaly and low lung volumes. No real change. Electronically Signed   By: Rolm Baptise M.D.   On: 07/23/2016 12:57   Dg Chest Port 1 View  Result Date: 07/14/2016 CLINICAL DATA:  72 year old with PICC line exchange. Subsequent encounter. EXAM: PORTABLE CHEST 1 VIEW COMPARISON:  07/13/2016. FINDINGS: Left PICC line has been changed with new left PICC line tip at the level of the proximal to mid superior vena cava. Endotracheal tube tip 4 cm above the carina. Right central line tip distal superior vena cava near the cavoatrial junction. Diffuse airspace disease with slight worsening from the prior exam which may represent pulmonary edema superimposed upon chronic changes. Limited for excluding underlying infectious infiltrate or mass. No gross pneumothorax. Limited evaluation mediastinal and cardiac silhouette without change. IMPRESSION: Left PICC line tip proximal to mid superior vena cava level. Slight worsening of diffuse airspace disease as noted above. Electronically Signed   By: Genia Del M.D.   On: 07/14/2016 11:31   Dg Chest Port 1 View  Result Date:  07/13/2016 CLINICAL DATA:  71 year old male with endotracheal tube in place. Subsequent encounter. EXAM: PORTABLE CHEST 1 VIEW COMPARISON:   07/08/2016. FINDINGS: Endotracheal tube tip 7.5 cm above the carina. Right central line catheter tip proximal right atrium level. Left PICC line has changed position with the tip now directed superiorly entering what appears to be the lower aspect of the right internal jugular vein. Diffuse airspace disease similar to prior exam. This may represent pulmonary edema however, underlying infiltrate or mass not excluded. Cardiomegaly. Tortuous calcified aorta. IMPRESSION: Left PICC line has changed position with the tip now directed superiorly entering what appears to be the lower aspect of the right internal jugular vein. Remainder of findings are similar to prior exam.  Please see above. These results will be called to the ordering clinician or representative by the Radiologist Assistant, and communication documented in the PACS or zVision Dashboard. Electronically Signed   By: Genia Del M.D.   On: 07/13/2016 12:29   Dg Chest Port 1 View  Result Date: 07/08/2016 CLINICAL DATA:  PICC line. EXAM: PORTABLE CHEST 1 VIEW COMPARISON:  One-view chest x-ray from the same day. FINDINGS: The heart size is normal. Endotracheal tube is stable 4 cm above the carina. NG tube courses off the inferior border the film. A right IJ catheter is stable. The left-sided PICC line has been manipulated and now terminates at the cavoatrial junction, in satisfactory position. Diffuse interstitial and airspace disease has slightly progressed, right greater than left. IMPRESSION: 1. Repositioning of left-sided PICC line, now in satisfactory position, just above the cavoatrial junction. 2. Support apparatus is otherwise stable. 3. Increasing interstitial and airspace disease compatible with edema or pneumonia. Electronically Signed   By: San Morelle M.D.   On: 07/08/2016 17:29   Dg Chest Port 1 View  Result Date: 07/08/2016 CLINICAL DATA:  71 year old male status post right below-the-knee amputation for nonhealing lower  extremity wound with postoperative course complicated by respiratory failure and ARDS. Acute renal failure in need of dialysis. Initial encounter. EXAM: PORTABLE CHEST 1 VIEW COMPARISON:  07/06/2016 and earlier. FINDINGS: Portable AP semi upright view at 1433 hours. Stable endotracheal tube tip at the level the clavicles. Left PICC line remains looped over the medial left clavicle. Right IJ approach dual lumen dialysis type catheter now in place. Faintly visible enteric tube at the level of the thoracic inlet. Stable cardiac size and mediastinal contours. Stable lung volumes. Continued widespread mostly interstitial pulmonary opacity. Superimposed more confluent biapical opacity. Superimposed dense retrocardiac opacity partially obscuring the diaphragm. Trace fluid or thickening along the right minor fissure. No other pleural effusion is evident. Overall pulmonary vascularity appears mildly increased and ventilation appears mildly decreased. IMPRESSION: 1. Right IJ dual lumen dialysis type catheter placed. Malpositioned left PICC line again noted. 2.  Otherwise stable lines and tubes. 3. Widespread bilateral pulmonary opacity with interval mildly worsened ventilation and progressed interstitial opacity favored due to pulmonary edema. Electronically Signed   By: Genevie Ann M.D.   On: 07/08/2016 14:58   Dg Chest Port 1 View  Result Date: 07/06/2016 CLINICAL DATA:  Pulmonary edema EXAM: PORTABLE CHEST 1 VIEW COMPARISON:  July 05, 2016 FINDINGS: Endotracheal tube tip is 3.0 cm above the carina. The central catheter loops on itself in the left subclavian region, unchanged. Nasogastric tube tip and side port are below the diaphragm. No pneumothorax evident. Widespread interstitial and patchy alveolar pulmonary edema remain. In comparison with 1 day prior, there is new airspace consolidation in the periphery  of the left upper lobe. Otherwise opacities appear stable bilaterally. There is cardiomegaly with pulmonary  venous hypertension. There are small pleural effusions bilaterally. There is aortic atherosclerosis. No evident adenopathy. IMPRESSION: Tube and catheter positions as described without pneumothorax. Note that the central catheter loops on itself in the left subclavian region, stable. Evidence of congestive heart failure with overall degree of interstitial and patchy alveolar edema stable compared to 1 day prior. There is, however, new airspace opacity in the periphery of the left upper lobe, concerning for a potential focus of superimposed pneumonia. Electronically Signed   By: Lowella Grip III M.D.   On: 07/06/2016 08:16   Dg Chest Port 1 View  Result Date: 07/05/2016 CLINICAL DATA:  Pulmonary edema EXAM: PORTABLE CHEST 1 VIEW COMPARISON:  July 04, 2016 FINDINGS: Stable ET tube. NG tube not well assessed due to penetration. A left PICC line coils in the left subclavian region, unchanged. Diffuse bilateral pulmonary opacities are worsened in the interval. No other interval changes. IMPRESSION: 1. Worsening diffuse bilateral pulmonary opacities. 2. Stable support apparatus. Electronically Signed   By: Dorise Bullion III M.D   On: 07/05/2016 09:39   Dg Chest Port 1 View  Result Date: 07/04/2016 CLINICAL DATA:  Pneumonia. EXAM: PORTABLE CHEST 1 VIEW COMPARISON:  July 03, 2016 FINDINGS: The ETT is in good position. The NG tube is not well seen distally due to poor penetration but appears to terminate below the diaphragm. No pneumothorax. Diffuse bilateral pulmonary opacities persist, improved in the interval. No change in the cardiomediastinal silhouette. IMPRESSION: 1. Support apparatus as above. 2. Diffuse bilateral pulmonary opacities persist but there has been significant interval improvement. Electronically Signed   By: Dorise Bullion III M.D   On: 07/04/2016 07:29   Dg Chest Port 1 View  Result Date: 07/03/2016 CLINICAL DATA:  Endotracheal tube.  Pulmonary edema. EXAM: PORTABLE CHEST 1  VIEW COMPARISON:  One-view chest x-ray 07/01/2016 FINDINGS: Endotracheal tube now terminates 3.3 cm above the carina, in satisfactory position. Right upper lobe airspace consolidation has progressed. Right lower lobe airspace disease is worse as well. There is increased diffuse edema. The heart is enlarged. Bilateral pleural effusions have progressed. A left-sided PICC line is stable in position. There is a loop that extends superiorly within the left innominate vein. The tip is directed caudal. IMPRESSION: 1. Increasing interstitial and airspace disease as well is bilateral pleural effusions with in the setting of cardiac enlargement compatible with congestive heart failure. 2. The endotracheal tube now terminates 3.3 cm above carina, in satisfactory position. 3. The left-sided PICC line remains looped within the innominate vein. 4. Progressive right upper lower lobe airspace consolidation concerning for pneumonia. Electronically Signed   By: San Morelle M.D.   On: 07/03/2016 10:04   Dg Chest Port 1 View  Result Date: 07/01/2016 CLINICAL DATA:  Respiratory difficulty EXAM: PORTABLE CHEST 1 VIEW COMPARISON:  06/29/2016 FINDINGS: Endotracheal tube placed. Tip is 9.0 cm from the carina. NG tube placed. The tip is beyond the gastroesophageal junction. Consolidation in the right upper lobe is stable. There is increasing consolidation at the lung bases right greater than left. Left upper extremity PICC has retracted and the tip is now in the left innominate vein. No pneumothorax. IMPRESSION: Endotracheal tube placement.  NG tube placement Stable right upper lobe airspace disease. Worsening bilateral lower lobe airspace disease. Left upper extremity PICC has retracted, and now the tip is in the left innominate vein. Electronically Signed   By: Arnell Sieving  Hoss M.D.   On: 07/01/2016 13:31   Dg Chest Port 1 View  Result Date: 06/29/2016 CLINICAL DATA:  Patient admitted about 2 hours ago from another hospital  for pneumonia and fluid in lungs. SOB and resp distress. EXAM: PORTABLE CHEST 1 VIEW COMPARISON:  Chest x-ray dated 11/23/2005. FINDINGS: There is mild cardiomegaly, not significantly changed. Overall cardiomediastinal silhouette is also not significantly changed given differences in patient positioning. There is bilateral perihilar pulmonary edema. More confluent airspace opacity is noted in the right upper lobe, confluent edema versus pneumonia. Additional denser opacity at the left lung base is likely atelectasis and/or small pleural effusion. IMPRESSION: 1. Bilateral perihilar pulmonary edema, consistent with CHF and/or volume overload. Additional more confluent airspace opacity within the right upper lobe could be associated confluent edema or pneumonia. 2. Cardiomegaly. 3. Probable atelectasis and/or small effusion at the left lung base. Electronically Signed   By: Franki Cabot M.D.   On: 06/29/2016 20:30   Dg Abd Portable 1v  Result Date: 07/15/2016 CLINICAL DATA:  Nasogastric tube placement.  Initial encounter. EXAM: PORTABLE ABDOMEN - 1 VIEW COMPARISON:  Abdominal radiograph performed earlier today at 10:35 a.m. FINDINGS: The patient's enteric tube is noted ending overlying the body of the stomach. The side-port is seen about the gastroesophageal junction. The visualized bowel gas pattern is grossly unremarkable. No free intra-abdominal air is seen, though evaluation for free air is limited on a single supine view. Clips are noted within the right upper quadrant, reflecting prior cholecystectomy. No acute osseous abnormalities are seen. IMPRESSION: Enteric tube noted ending about the body of the stomach. Side port about the gastroesophageal junction. Electronically Signed   By: Garald Balding M.D.   On: 07/15/2016 19:55   Dg Abd Portable 1v  Result Date: 07/15/2016 CLINICAL DATA:  NG tube placement EXAM: PORTABLE ABDOMEN - 1 VIEW COMPARISON:  07/15/2016 FINDINGS: Stable pleuroparenchymal opacity  left lung base. Postcholecystectomy surgical clips are noted. There is NG tube in place with tip in proximal stomach the IMPRESSION: NG tube in place with tip in proximal stomach. Electronically Signed   By: Lahoma Crocker M.D.   On: 07/15/2016 10:47   Dg Abd Portable 1v  Result Date: 07/15/2016 CLINICAL DATA:  NG tube placement EXAM: PORTABLE ABDOMEN - 1 VIEW COMPARISON:  None. FINDINGS: The NG tube is coiling within the mid distal esophagus with the tip extending cephalad. The tube does not extend below the hemidiaphragm. Pleuroparenchymal opacity in the left lung base is stable. IMPRESSION: NG tube coils in the mid distal esophagus with the tip re- entering the more proximal esophagus. Electronically Signed   By: Ivar Drape M.D.   On: 07/15/2016 08:41    Labs:  CBC:  Recent Labs  07/22/16 0638 07/23/16 0647 07/27/16 0644 07/29/16 0538  WBC 11.0* 10.8* 7.5 6.3  HGB 8.3* 8.1* 7.8* 7.8*  HCT 26.0* 25.5* 24.4* 23.9*  PLT 301 267 191 166    COAGS:  Recent Labs  06/30/16 1400 07/15/16 1528 07/15/16 1639  INR 1.55 >10.00* 1.52    BMP:  Recent Labs  07/22/16 0637 07/23/16 0647 07/27/16 0644 07/29/16 0538  NA 135 133* 133* 132*  K 4.1 3.8 4.8 4.3  CL 96* 94* 95* 95*  CO2 24 26 23 23   GLUCOSE 129* 122* 159* 220*  BUN 109* 75* 90* 76*  CALCIUM 7.5* 7.5* 7.8* 7.8*  CREATININE 5.14* 4.31* 6.20* 5.60*  GFRNONAA 10* 13* 8* 9*  GFRAA 12* 15* 9* 11*  LIVER FUNCTION TESTS:  Recent Labs  06/30/16 0450  07/22/16 4656 07/23/16 0647 07/27/16 0644 07/29/16 0538  BILITOT 1.1  --   --   --   --   --   AST 19  --   --   --   --   --   ALT 12*  --   --   --   --   --   ALKPHOS 86  --   --   --   --   --   PROT 5.6*  --   --   --   --   --   ALBUMIN 1.7*  < > 1.9* 1.9* 2.0* 2.0*  < > = values in this interval not displayed.  TUMOR MARKERS: No results for input(s): AFPTM, CEA, CA199, CHROMGRNA in the last 8760 hours.  Assessment and Plan:  Previous osteomyelitis/ R  BKA; partial L foot amputation Slow to heal wounds PCM Dysphagia  Need for long term care Scheduled for percutaneous gastric tube placement in IR 2/11 BC NGTD UA clear afeb Risks and Benefits discussed with the patient's son including, but not limited to the need for a barium enema during the procedure, bleeding, infection, peritonitis, or damage to adjacent structures. All of his questions were answered, he is agreeable to proceed. Consent signed and in chart.   Thank you for this interesting consult.  I greatly enjoyed meeting Taylor Maldonado and look forward to participating in their care.  A copy of this report was sent to the requesting provider on this date.  Electronically Signed: Monia Sabal A 07/29/2016, 11:14 AM   I spent a total of 40 Minutes    in face to face in clinical consultation, greater than 50% of which was counseling/coordinating care for percutaneous gastric tube placement

## 2016-07-30 ENCOUNTER — Encounter (HOSPITAL_COMMUNITY): Payer: Self-pay | Admitting: Interventional Radiology

## 2016-07-30 ENCOUNTER — Other Ambulatory Visit (HOSPITAL_COMMUNITY): Payer: Federal, State, Local not specified - PPO

## 2016-07-30 HISTORY — PX: IR GENERIC HISTORICAL: IMG1180011

## 2016-07-30 MED ORDER — LIDOCAINE HCL (PF) 1 % IJ SOLN
INTRAMUSCULAR | Status: AC
  Filled 2016-07-30: qty 10

## 2016-07-30 MED ORDER — FENTANYL CITRATE (PF) 100 MCG/2ML IJ SOLN
INTRAMUSCULAR | Status: AC | PRN
  Administered 2016-07-30: 25 ug via INTRAVENOUS

## 2016-07-30 MED ORDER — MIDAZOLAM HCL 2 MG/2ML IJ SOLN
INTRAMUSCULAR | Status: AC | PRN
  Administered 2016-07-30 (×2): 0.5 mg via INTRAVENOUS

## 2016-07-30 MED ORDER — LIDOCAINE HCL (PF) 1 % IJ SOLN
INTRAMUSCULAR | Status: AC | PRN
  Administered 2016-07-30: 5 mL

## 2016-07-30 MED ORDER — MIDAZOLAM HCL 2 MG/2ML IJ SOLN
INTRAMUSCULAR | Status: AC
  Filled 2016-07-30: qty 2

## 2016-07-30 MED ORDER — FENTANYL CITRATE (PF) 100 MCG/2ML IJ SOLN
INTRAMUSCULAR | Status: AC
  Filled 2016-07-30: qty 2

## 2016-07-30 MED ORDER — IOPAMIDOL (ISOVUE-300) INJECTION 61%
INTRAVENOUS | Status: AC
  Administered 2016-07-30: 15 mL
  Filled 2016-07-30: qty 50

## 2016-07-30 NOTE — Sedation Documentation (Signed)
Gastric tube placed in [facility].

## 2016-07-30 NOTE — Sedation Documentation (Addendum)
[facility] arrived [facility], [facility] Antibiotic given that was brought from RN. Patient alert x1, VSS.

## 2016-07-30 NOTE — Procedures (Signed)
Interventional Radiology Procedure Note  Procedure:  Percutaneous Gastrostomy  Complications:  None  Estimated Blood Loss: < 10 mL  20 Fr bumper retention gastrostomy tube placed with tip in body of stomach.  OK to use in 24 hours.  Taylor Maldonado, M.D Pager:  [phone]    

## 2016-07-31 ENCOUNTER — Other Ambulatory Visit (HOSPITAL_COMMUNITY): Payer: Federal, State, Local not specified - PPO

## 2016-07-31 LAB — CULTURE, BLOOD (ROUTINE X 2)
CULTURE: NO GROWTH
Culture: NO GROWTH

## 2016-07-31 LAB — CBC
HCT: 22.2 % — ABNORMAL LOW (ref 39.0–52.0)
HCT: 23.2 % — ABNORMAL LOW (ref 39.0–52.0)
HEMOGLOBIN: 7.4 g/dL — AB (ref 13.0–17.0)
Hemoglobin: 7.2 g/dL — ABNORMAL LOW (ref 13.0–17.0)
MCH: 28.3 pg (ref 26.0–34.0)
MCH: 27.8 pg (ref 26.0–34.0)
MCHC: 32.4 g/dL (ref 30.0–36.0)
MCHC: 31.9 g/dL (ref 30.0–36.0)
MCV: 87.4 fL (ref 78.0–100.0)
MCV: 87.2 fL (ref 78.0–100.0)
PLATELETS: 158 10*3/uL (ref 150–400)
Platelets: 136 10*3/uL — ABNORMAL LOW (ref 150–400)
RBC: 2.54 MIL/uL — AB (ref 4.22–5.81)
RBC: 2.66 MIL/uL — AB (ref 4.22–5.81)
RDW: 15.9 % — ABNORMAL HIGH (ref 11.5–15.5)
RDW: 16 % — ABNORMAL HIGH (ref 11.5–15.5)
WBC: 7.9 10*3/uL (ref 4.0–10.5)
WBC: 7.7 10*3/uL (ref 4.0–10.5)

## 2016-07-31 LAB — RENAL FUNCTION PANEL
ANION GAP: 11 (ref 5–15)
Albumin: 2.2 g/dL — ABNORMAL LOW (ref 3.5–5.0)
BUN: 55 mg/dL — ABNORMAL HIGH (ref 6–20)
CALCIUM: 8.2 mg/dL — AB (ref 8.9–10.3)
CHLORIDE: 98 mmol/L — AB (ref 101–111)
CO2: 27 mmol/L (ref 22–32)
Creatinine, Ser: 5.29 mg/dL — ABNORMAL HIGH (ref 0.61–1.24)
GFR calc non Af Amer: 10 mL/min — ABNORMAL LOW (ref >60)
GFR, EST AFRICAN AMERICAN: 11 mL/min — AB (ref >60)
GLUCOSE: 136 mg/dL — AB (ref 65–99)
POTASSIUM: 4 mmol/L (ref 3.5–5.1)
Phosphorus: 5 mg/dL — ABNORMAL HIGH (ref 2.5–4.6)
SODIUM: 136 mmol/L (ref 135–145)

## 2016-07-31 NOTE — Progress Notes (Signed)
Progress noted placed in paper chart as computer with epic wasn't available today in [facility].  G-tube in good position with no issues.  It may be used.  Verba Ainley E 11:00 AM 07/31/2016

## 2016-07-31 NOTE — Progress Notes (Signed)
[facility]  ROUNDING NOTE   Subjective:  Patient had PEG tube placed yesterday. He is due for hemodialysis this a.m. As well. Resting comfortably in bed at the moment. Blood noted in the Foley bag. Patient has been pulling at the Foley catheter.  Objective:  Vital signs in last 24 hours:  Temperature 98.1 pulse 94 respirations 18 blood pressure 156/77  Physical Exam: General: Critically ill appearing  Head: NCAT OM moist  Eyes: Eyes open  Neck: Supple  Lungs:  Coarse rhonchi, normal work of breathing  Heart: S1S2 no rubs, irregular  Abdomen:  Soft, nontender, bowel sounds present, +PEG  Extremities: Right BKA, 1+ LLE edema  Neurologic: Awake, follows simple commands  Skin: No acute rashes  Access: R IJ temporary dialysis catheter replaced 9/32/67    Basic Metabolic Panel:  Recent Labs Lab 07/27/16 0644 07/29/16 0538  NA 133* 132*  K 4.8 4.3  CL 95* 95*  CO2 23 23  GLUCOSE 159* 220*  BUN 90* 76*  CREATININE 6.20* 5.60*  CALCIUM 7.8* 7.8*  MG 2.6*  --   PHOS 8.0* 7.2*    Liver Function Tests:  Recent Labs Lab 07/27/16 0644 07/29/16 0538  ALBUMIN 2.0* 2.0*   No results for input(s): LIPASE, AMYLASE in the last 168 hours. No results for input(s): AMMONIA in the last 168 hours.  CBC:  Recent Labs Lab 07/27/16 0644 07/29/16 0538  WBC 7.5 6.3  HGB 7.8* 7.8*  HCT 24.4* 23.9*  MCV 87.5 86.9  PLT 191 166    Cardiac Enzymes: No results for input(s): CKTOTAL, CKMB, CKMBINDEX, TROPONINI in the last 168 hours.  BNP: Invalid input(s): POCBNP  CBG: No results for input(s): GLUCAP in the last 168 hours.  Microbiology: Results for orders placed or performed during the hospital encounter of 06/29/16  Culture, blood (routine x 2)     Status: None   Collection Time: 06/29/16  6:12 PM  Result Value Ref Range Status   Specimen Description BLOOD LEFT HAND  Final   Special Requests IN PEDIATRIC BOTTLE [place]  Final   Culture NO GROWTH 5 DAYS   Final   Report Status 07/04/2016 FINAL  Final  Culture, blood (routine x 2)     Status: None   Collection Time: 06/29/16  6:23 PM  Result Value Ref Range Status   Specimen Description BLOOD RIGHT HAND  Final   Special Requests BOTTLES DRAWN AEROBIC AND ANAEROBIC 5CC  Final   Culture NO GROWTH 5 DAYS  Final   Report Status 07/04/2016 FINAL  Final  Urine culture     Status: None   Collection Time: 06/30/16  7:39 AM  Result Value Ref Range Status   Specimen Description URINE, RANDOM  Final   Special Requests NONE  Final   Culture NO GROWTH  Final   Report Status 07/01/2016 FINAL  Final  Culture, blood (routine x 2)     Status: None   Collection Time: 07/04/16  2:58 PM  Result Value Ref Range Status   Specimen Description BLOOD LEFT HAND  Final   Special Requests BOTTLES DRAWN AEROBIC ONLY 10CC  Final   Culture NO GROWTH 5 DAYS  Final   Report Status 07/09/2016 FINAL  Final  Culture, blood (routine x 2)     Status: None   Collection Time: 07/04/16  3:02 PM  Result Value Ref Range Status   Specimen Description BLOOD LEFT HAND  Final   Special Requests BOTTLES DRAWN AEROBIC ONLY 10CC  Final  Culture NO GROWTH 5 DAYS  Final   Report Status 07/09/2016 FINAL  Final  Culture, respiratory (NON-Expectorated)     Status: None   Collection Time: 07/04/16  4:03 PM  Result Value Ref Range Status   Specimen Description TRACHEAL ASPIRATE  Final   Special Requests NONE  Final   Gram Stain   Final    ABUNDANT WBC PRESENT, PREDOMINANTLY PMN RARE GRAM NEGATIVE RODS RARE YEAST    Culture FEW KLEBSIELLA OXYTOCA  Final   Report Status 07/06/2016 FINAL  Final   Organism ID, Bacteria KLEBSIELLA OXYTOCA  Final      Susceptibility   Klebsiella oxytoca - MIC*    AMPICILLIN >=32 RESISTANT Resistant     CEFAZOLIN <=4 SENSITIVE Sensitive     CEFEPIME <=1 SENSITIVE Sensitive     CEFTAZIDIME <=1 SENSITIVE Sensitive     CEFTRIAXONE <=1 SENSITIVE Sensitive     CIPROFLOXACIN <=0.25 SENSITIVE Sensitive      GENTAMICIN <=1 SENSITIVE Sensitive     IMIPENEM <=0.25 SENSITIVE Sensitive     TRIMETH/SULFA <=20 SENSITIVE Sensitive     AMPICILLIN/SULBACTAM 8 SENSITIVE Sensitive     PIP/TAZO <=4 SENSITIVE Sensitive     Extended ESBL NEGATIVE Sensitive     * FEW KLEBSIELLA OXYTOCA  Culture, Urine     Status: Abnormal   Collection Time: 07/04/16  5:55 PM  Result Value Ref Range Status   Specimen Description URINE, RANDOM  Final   Special Requests NONE  Final   Culture >=100,000 COLONIES/mL YEAST (A)  Final   Report Status 07/05/2016 FINAL  Final  Culture, respiratory (NON-Expectorated)     Status: None   Collection Time: 07/09/16  2:26 PM  Result Value Ref Range Status   Specimen Description TRACHEAL ASPIRATE  Final   Special Requests NONE  Final   Gram Stain   Final    MODERATE WBC PRESENT, PREDOMINANTLY PMN NO ORGANISMS SEEN    Culture   Final    ABUNDANT PSEUDOMONAS AERUGINOSA MODERATE KLEBSIELLA OXYTOCA    Report Status 07/13/2016 FINAL  Final   Organism ID, Bacteria PSEUDOMONAS AERUGINOSA  Final   Organism ID, Bacteria KLEBSIELLA OXYTOCA  Final      Susceptibility   Klebsiella oxytoca - MIC*    AMPICILLIN >=32 RESISTANT Resistant     CEFAZOLIN <=4 SENSITIVE Sensitive     CEFEPIME <=1 SENSITIVE Sensitive     CEFTAZIDIME <=1 SENSITIVE Sensitive     CEFTRIAXONE <=1 SENSITIVE Sensitive     CIPROFLOXACIN <=0.25 SENSITIVE Sensitive     GENTAMICIN <=1 SENSITIVE Sensitive     IMIPENEM <=0.25 SENSITIVE Sensitive     TRIMETH/SULFA <=20 SENSITIVE Sensitive     AMPICILLIN/SULBACTAM 8 SENSITIVE Sensitive     PIP/TAZO <=4 SENSITIVE Sensitive     Extended ESBL NEGATIVE Sensitive     * MODERATE KLEBSIELLA OXYTOCA   Pseudomonas aeruginosa - MIC*    CEFTAZIDIME 4 SENSITIVE Sensitive     CIPROFLOXACIN 0.5 SENSITIVE Sensitive     GENTAMICIN <=1 SENSITIVE Sensitive     IMIPENEM 2 SENSITIVE Sensitive     PIP/TAZO 16 SENSITIVE Sensitive     CEFEPIME 4 SENSITIVE Sensitive     * ABUNDANT  PSEUDOMONAS AERUGINOSA  Culture, expectorated sputum-assessment     Status: None   Collection Time: 07/23/16 12:26 PM  Result Value Ref Range Status   Specimen Description EXPECTORATED SPUTUM  Final   Special Requests NONE  Final   Sputum evaluation THIS SPECIMEN IS ACCEPTABLE FOR SPUTUM CULTURE  Final  Report Status 07/23/2016 FINAL  Final  Culture, respiratory (NON-Expectorated)     Status: None   Collection Time: 07/23/16 12:26 PM  Result Value Ref Range Status   Specimen Description EXPECTORATED SPUTUM  Final   Special Requests NONE Reflexed from [id number]  Final   Gram Stain   Final    ABUNDANT WBC PRESENT, PREDOMINANTLY PMN FEW SQUAMOUS EPITHELIAL CELLS PRESENT FEW GRAM POSITIVE COCCI IN PAIRS RARE GRAM NEGATIVE COCCOBACILLI    Culture FEW PSEUDOMONAS AERUGINOSA  Final   Report Status 07/26/2016 FINAL  Final   Organism ID, Bacteria PSEUDOMONAS AERUGINOSA  Final      Susceptibility   Pseudomonas aeruginosa - MIC*    CEFTAZIDIME <=1 SENSITIVE Sensitive     CIPROFLOXACIN 2 INTERMEDIATE Intermediate     GENTAMICIN <=1 SENSITIVE Sensitive     IMIPENEM 8 INTERMEDIATE Intermediate     PIP/TAZO <=4 SENSITIVE Sensitive     CEFEPIME <=1 SENSITIVE Sensitive     * FEW PSEUDOMONAS AERUGINOSA  Culture, blood (routine x 2)     Status: None   Collection Time: 07/23/16  2:50 PM  Result Value Ref Range Status   Specimen Description BLOOD RIGHT HAND  Final   Special Requests IN PEDIATRIC BOTTLE 2.5CC  Final   Culture NO GROWTH 5 DAYS  Final   Report Status 07/28/2016 FINAL  Final  Culture, blood (routine x 2)     Status: Abnormal   Collection Time: 07/23/16  2:55 PM  Result Value Ref Range Status   Specimen Description BLOOD LEFT HAND  Final   Special Requests IN PEDIATRIC BOTTLE 2.5CC  Final   Culture  Setup Time   Final    GRAM POSITIVE COCCI IN CLUSTERS IN PEDIATRIC BOTTLE Organism ID to follow CRITICAL RESULT CALLED TO, READ BACK BY AND VERIFIED WITH: Ezzard Standing RN 14:55 07/24/16   ([email or web address])    Culture (A)  Final    STAPHYLOCOCCUS SPECIES (COAGULASE NEGATIVE) THE SIGNIFICANCE OF ISOLATING THIS ORGANISM FROM A SINGLE SET OF BLOOD CULTURES WHEN MULTIPLE SETS ARE DRAWN IS UNCERTAIN. PLEASE NOTIFY THE MICROBIOLOGY DEPARTMENT WITHIN ONE WEEK IF SPECIATION AND SENSITIVITIES ARE REQUIRED.    Report Status 07/25/2016 FINAL  Final  Blood Culture ID Panel (Reflexed)     Status: Abnormal   Collection Time: 07/23/16  2:55 PM  Result Value Ref Range Status   Enterococcus species NOT DETECTED NOT DETECTED Final   Listeria monocytogenes NOT DETECTED NOT DETECTED Final   Staphylococcus species DETECTED (A) NOT DETECTED Final    Comment: Methicillin (oxacillin) resistant coagulase negative staphylococcus. Possible blood culture contaminant (unless isolated from more than one blood culture draw or clinical case suggests pathogenicity). No antibiotic treatment is indicated for blood  culture contaminants. CRITICAL RESULT CALLED TO, READ BACK BY AND VERIFIED WITH: Ezzard Standing RN 14:55 07/24/16 ([email or web address])    Staphylococcus aureus NOT DETECTED NOT DETECTED Final   Methicillin resistance DETECTED (A) NOT DETECTED Final    Comment: CRITICAL RESULT CALLED TO, READ BACK BY AND VERIFIED WITH: Ezzard Standing RN 14:55 07/24/16 ([email or web address])    Streptococcus species NOT DETECTED NOT DETECTED Final   Streptococcus agalactiae NOT DETECTED NOT DETECTED Final   Streptococcus pneumoniae NOT DETECTED NOT DETECTED Final   Streptococcus pyogenes NOT DETECTED NOT DETECTED Final   Acinetobacter baumannii NOT DETECTED NOT DETECTED Final   Enterobacteriaceae species NOT DETECTED NOT DETECTED Final   Enterobacter cloacae complex NOT DETECTED NOT DETECTED Final   Escherichia coli NOT DETECTED NOT DETECTED Final   Klebsiella oxytoca  NOT DETECTED NOT DETECTED Final   Klebsiella pneumoniae NOT DETECTED NOT DETECTED Final   Proteus species NOT DETECTED NOT DETECTED Final   Serratia marcescens NOT DETECTED NOT DETECTED Final    Haemophilus influenzae NOT DETECTED NOT DETECTED Final   Neisseria meningitidis NOT DETECTED NOT DETECTED Final   Pseudomonas aeruginosa NOT DETECTED NOT DETECTED Final   Candida albicans NOT DETECTED NOT DETECTED Final   Candida glabrata NOT DETECTED NOT DETECTED Final   Candida krusei NOT DETECTED NOT DETECTED Final   Candida parapsilosis NOT DETECTED NOT DETECTED Final   Candida tropicalis NOT DETECTED NOT DETECTED Final  Culture, Urine     Status: Abnormal   Collection Time: 07/23/16  3:05 PM  Result Value Ref Range Status   Specimen Description URINE, RANDOM  Final   Special Requests NONE  Final   Culture >=100,000 COLONIES/mL KLEBSIELLA PNEUMONIAE (A)  Final   Report Status 07/25/2016 FINAL  Final   Organism ID, Bacteria KLEBSIELLA PNEUMONIAE (A)  Final      Susceptibility   Klebsiella pneumoniae - MIC*    AMPICILLIN >=32 RESISTANT Resistant     CEFAZOLIN <=4 SENSITIVE Sensitive     CEFTRIAXONE <=1 SENSITIVE Sensitive     CIPROFLOXACIN >=4 RESISTANT Resistant     GENTAMICIN <=1 SENSITIVE Sensitive     IMIPENEM <=0.25 SENSITIVE Sensitive     NITROFURANTOIN 128 RESISTANT Resistant     TRIMETH/SULFA <=20 SENSITIVE Sensitive     AMPICILLIN/SULBACTAM 16 INTERMEDIATE Intermediate     PIP/TAZO 16 SENSITIVE Sensitive     Extended ESBL NEGATIVE Sensitive     * >=100,000 COLONIES/mL KLEBSIELLA PNEUMONIAE  Culture, blood (routine x 2)     Status: None (Preliminary result)   Collection Time: 07/26/16 12:23 PM  Result Value Ref Range Status   Specimen Description BLOOD RIGHT HAND  Final   Special Requests AEROBIC BOTTLE ONLY 6CC  Final   Culture NO GROWTH 4 DAYS  Final   Report Status PENDING  Incomplete  Culture, blood (routine x 2)     Status: None (Preliminary result)   Collection Time: 07/26/16 12:23 PM  Result Value Ref Range Status   Specimen Description ARM RIGHT  Final   Special Requests AEROBIC BOTTLE ONLY 5CC  Final   Culture NO GROWTH 4 DAYS  Final   Report Status  PENDING  Incomplete  Culture, Urine     Status: Abnormal   Collection Time: 07/27/16  4:11 AM  Result Value Ref Range Status   Specimen Description URINE, CATHETERIZED  Final   Special Requests NONE  Final   Culture MULTIPLE SPECIES PRESENT, SUGGEST RECOLLECTION (A)  Final   Report Status 07/28/2016 FINAL  Final    Coagulation Studies:  Recent Labs  07/29/16 1126  LABPROT 15.4*  INR 1.22    Urinalysis:  Recent Labs  07/29/16 0445  COLORURINE AMBER*  LABSPEC 1.014  PHURINE 7.0  GLUCOSEU NEGATIVE  HGBUR SMALL*  BILIRUBINUR NEGATIVE  KETONESUR NEGATIVE  PROTEINUR 100*  NITRITE NEGATIVE  LEUKOCYTESUR MODERATE*      Imaging: Ir Gastrostomy Tube Mod Sed  Result Date: 07/30/2016 CLINICAL DATA:  Dysphagia, renal failure, respiratory failure and need for gastrostomy tube for long-term nutrition. EXAM: PERCUTANEOUS GASTROSTOMY TUBE PLACEMENT ANESTHESIA/SEDATION: 1.0 mg IV Versed; 25 mcg IV Fentanyl. Total Moderate Sedation Time 20 minutes. The patient's level of consciousness and physiologic status were continuously monitored during the procedure by Radiology nursing. CONTRAST:  15 mL Isovue-300 MEDICATIONS: 1 g IV vancomycin. IV antibiotic was administered in  an appropriate time interval prior to needle puncture of the skin. FLUOROSCOPY TIME:  7 minutes and 24 seconds.  97 mGy. PROCEDURE: The procedure, risks, benefits, and alternatives were explained to the patient's wife. Questions regarding the procedure were encouraged and answered. The patient's wife understands and consents to the procedure. A 5-French catheter was then advanced through the the patient's mouth under fluoroscopy into the esophagus and to the level of the stomach. This catheter was used to insufflate the stomach with air under fluoroscopy. The abdominal wall was prepped with Betadine in a sterile fashion, and a sterile drape was applied covering the operative field. A sterile gown and sterile gloves were used for  the procedure. Local anesthesia was provided with 1% Lidocaine. A skin incision was made in the upper abdominal wall. Under fluoroscopy, an 18 gauge trocar needle was advanced into the stomach. Contrast injection was performed to confirm intraluminal position of the needle tip. A single T tack was then deployed in the lumen of the stomach. This was brought up to tension at the skin surface. Over a guidewire, a 9-French sheath was advanced into the lumen of the stomach. The wire was left in place as a safety wire. A loop snare device from a percutaneous gastrostomy kit was then advanced into the stomach. A floppy guide wire was advanced through the orogastric catheter under fluoroscopy in the stomach. The loop snare advanced through the percutaneous gastric access was used to snare the guide wire. This allowed withdrawal of the loop snare out of the patient's mouth by retraction of the orogastric catheter and wire. A 20-French bumper retention gastrostomy tube was looped around the snare device. It was then pulled back through the patient's mouth. The retention bumper was brought up to the anterior gastric wall. The T tack suture was cut at the skin. The exiting gastrostomy tube was cut to appropriate length and a feeding adapter applied. The catheter was injected with contrast material to confirm position and a fluoroscopic spot image saved. The tube was then flushed with saline. A dressing was applied over the gastrostomy exit site. COMPLICATIONS: None. FINDINGS: The stomach distended well with air allowing safe placement of the gastrostomy tube. After placement, the tip of the gastrostomy tube lies in the body of the stomach. IMPRESSION: Percutaneous gastrostomy with placement of a 20-French bumper retention tube in the body of the stomach. This tube can be used for percutaneous feeds beginning in 24 hours after placement. Electronically Signed   By: Aletta Edouard M.D.   On: 07/30/2016 13:07     Medications:        Assessment/ Plan:  71 y.o.  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   1. Acute Renal failure / Solitary rt Pelvic Kidney 2. CKD st 3. Baseline Cr 1.27 (d/c from [facility]) Jun 29, 2016 3. B/L Pleural effusions and Acute respiratory failure requiring ventilator support 4. Anasarca/Generalized edema - Low albumin  - 3rd spacing of fluid  5. Fever/Bacteremia, coagulase negative staph noted in blood 6. Anemia of CKD. PRBC transfusion on 2/2  Plan:  Patient continues to have diminished renal function. As such he will need to continue renal replacement therapy at this point in time.  We plan for hemodialysis this a.m. In addition we will plan for doses on Monday. We plan to follow up serum electrolytes today as well as CBC. PEG tube was just place yesterday as well. As before patient has a very guarded prognosis given the suspected malignancy, history of MAC infection, acute renal failure, and generalized debility now.   LOS: 0 Donnesha Karg 2/16/20188:01 AM

## 2016-08-01 LAB — BASIC METABOLIC PANEL
Anion gap: 13 (ref 5–15)
BUN: 33 mg/dL — AB (ref 6–20)
CALCIUM: 8.2 mg/dL — AB (ref 8.9–10.3)
CHLORIDE: 96 mmol/L — AB (ref 101–111)
CO2: 25 mmol/L (ref 22–32)
CREATININE: 3.91 mg/dL — AB (ref 0.61–1.24)
GFR, EST AFRICAN AMERICAN: 17 mL/min — AB (ref >60)
GFR, EST NON AFRICAN AMERICAN: 14 mL/min — AB (ref >60)
Glucose, Bld: 129 mg/dL — ABNORMAL HIGH (ref 65–99)
Potassium: 3.8 mmol/L (ref 3.5–5.1)
SODIUM: 134 mmol/L — AB (ref 135–145)

## 2016-08-01 LAB — CBC
HCT: 23.2 % — ABNORMAL LOW (ref 39.0–52.0)
HEMOGLOBIN: 7.5 g/dL — AB (ref 13.0–17.0)
MCH: 28.7 pg (ref 26.0–34.0)
MCHC: 32.3 g/dL (ref 30.0–36.0)
MCV: 88.9 fL (ref 78.0–100.0)
PLATELETS: 143 10*3/uL — AB (ref 150–400)
RBC: 2.61 MIL/uL — ABNORMAL LOW (ref 4.22–5.81)
RDW: 16.1 % — ABNORMAL HIGH (ref 11.5–15.5)
WBC: 8 10*3/uL (ref 4.0–10.5)

## 2016-08-01 LAB — PROTIME-INR
INR: 1.31
PROTHROMBIN TIME: 16.3 s — AB (ref 11.4–15.2)

## 2016-08-02 LAB — BASIC METABOLIC PANEL
Anion gap: 12 (ref 5–15)
Anion gap: 12 (ref 5–15)
BUN: 50 mg/dL — AB (ref 6–20)
BUN: 53 mg/dL — AB (ref 6–20)
CHLORIDE: 97 mmol/L — AB (ref 101–111)
CO2: 26 mmol/L (ref 22–32)
CO2: 26 mmol/L (ref 22–32)
Calcium: 8.4 mg/dL — ABNORMAL LOW (ref 8.9–10.3)
Calcium: 8.2 mg/dL — ABNORMAL LOW (ref 8.9–10.3)
Chloride: 95 mmol/L — ABNORMAL LOW (ref 101–111)
Creatinine, Ser: 5.3 mg/dL — ABNORMAL HIGH (ref 0.61–1.24)
Creatinine, Ser: 5.57 mg/dL — ABNORMAL HIGH (ref 0.61–1.24)
GFR, EST AFRICAN AMERICAN: 11 mL/min — AB (ref >60)
GFR, EST AFRICAN AMERICAN: 11 mL/min — AB (ref >60)
GFR, EST NON AFRICAN AMERICAN: 10 mL/min — AB (ref >60)
GFR, EST NON AFRICAN AMERICAN: 9 mL/min — AB (ref >60)
Glucose, Bld: 76 mg/dL (ref 65–99)
Glucose, Bld: 209 mg/dL — ABNORMAL HIGH (ref 65–99)
POTASSIUM: 5.5 mmol/L — AB (ref 3.5–5.1)
POTASSIUM: 4.3 mmol/L (ref 3.5–5.1)
SODIUM: 135 mmol/L (ref 135–145)
SODIUM: 133 mmol/L — AB (ref 135–145)

## 2016-08-03 LAB — CBC
HEMATOCRIT: 20.5 % — AB (ref 39.0–52.0)
Hemoglobin: 6.7 g/dL — CL (ref 13.0–17.0)
MCH: 28.6 pg (ref 26.0–34.0)
MCHC: 32.2 g/dL (ref 30.0–36.0)
MCV: 88.7 fL (ref 78.0–100.0)
Platelets: 145 10*3/uL — ABNORMAL LOW (ref 150–400)
RBC: 2.31 MIL/uL — ABNORMAL LOW (ref 4.22–5.81)
RDW: 17.1 % — AB (ref 11.5–15.5)
WBC: 8.7 10*3/uL (ref 4.0–10.5)

## 2016-08-03 LAB — RENAL FUNCTION PANEL
ALBUMIN: 2.1 g/dL — AB (ref 3.5–5.0)
Anion gap: 15 (ref 5–15)
BUN: 66 mg/dL — AB (ref 6–20)
CO2: 25 mmol/L (ref 22–32)
CREATININE: 6.47 mg/dL — AB (ref 0.61–1.24)
Calcium: 8.2 mg/dL — ABNORMAL LOW (ref 8.9–10.3)
Chloride: 93 mmol/L — ABNORMAL LOW (ref 101–111)
GFR calc Af Amer: 9 mL/min — ABNORMAL LOW (ref >60)
GFR, EST NON AFRICAN AMERICAN: 8 mL/min — AB (ref >60)
GLUCOSE: 187 mg/dL — AB (ref 65–99)
PHOSPHORUS: 6.8 mg/dL — AB (ref 2.5–4.6)
POTASSIUM: 4.4 mmol/L (ref 3.5–5.1)
SODIUM: 133 mmol/L — AB (ref 135–145)

## 2016-08-03 LAB — PREPARE RBC (CROSSMATCH)

## 2016-08-03 NOTE — Progress Notes (Signed)
[facility]  ROUNDING NOTE   Subjective:  Patient had PEG tube . TF 40 cc/hr HD done. 2.5 L removed Resting comfortably in bed at the moment. Blood noted in the Foley bag.    Objective:  Vital signs in last 24 hours:  Temperature 98.9 pulse 102 respirations 18 blood pressure 139/80  Physical Exam: General: Critically ill appearing  Head: NCAT OM moist  Eyes: Eyes open  Neck: Supple  Lungs:  Coarse rhonchi, normal work of breathing  Heart: S1S2 no rubs, irregular  Abdomen:  Soft, nontender, bowel sounds present, +PEG  Extremities: Right BKA, 1+ LLE edema  Neurologic: somnolent  Skin: No acute rashes  Access: R IJ temporary dialysis catheter replaced 09/02/20    Basic Metabolic Panel:  Recent Labs Lab 07/29/16 0538 07/31/16 0828 08/01/16 0714 08/02/16 0520 08/02/16 1436 08/03/16 0500  NA 132* 136 134* 135 133* 133*  K 4.3 4.0 3.8 5.5* 4.3 4.4  CL 95* 98* 96* 97* 95* 93*  CO2 _0 GLUCOSE 220* 136* 129* 76 209* 187*  BUN 76* 55* 33* 50* 53* 66*  CREATININE 5.60* 5.29* 3.91* 5.30* 5.57* 6.47*  CALCIUM 7.8* 8.2* 8.2* 8.4* 8.2* 8.2*  PHOS 7.2* 5.0*  --   --   --  6.8*    Liver Function Tests:  Recent Labs Lab 07/29/16 0538 07/31/16 0828 08/03/16 0500  ALBUMIN 2.0* 2.2* 2.1*   No results for input(s): LIPASE, AMYLASE in the last 168 hours. No results for input(s): AMMONIA in the last 168 hours.  CBC:  Recent Labs Lab 07/29/16 0538 07/31/16 0828 07/31/16 1503 08/01/16 0714 08/03/16 0500  WBC 6.3 7.9 7.7 8.0 8.7  HGB 7.8* 7.2* 7.4* 7.5* 6.7*  HCT 23.9* 22.2* 23.2* 23.2* 20.5*  MCV 86.9 87.4 87.2 88.9 88.7  PLT 166 158 136* 143* 145*    Cardiac Enzymes: No results for input(s): CKTOTAL, CKMB, CKMBINDEX, TROPONINI in the last 168 hours.  BNP: Invalid input(s): POCBNP  CBG: No results for input(s): GLUCAP in the last 168 hours.  Microbiology: Results for orders placed or performed during the hospital encounter of  06/29/16  Culture, blood (routine x 2)     Status: None   Collection Time: 06/29/16  6:12 PM  Result Value Ref Range Status   Specimen Description BLOOD LEFT HAND  Final   Special Requests IN PEDIATRIC BOTTLE [place]  Final   Culture NO GROWTH 5 DAYS  Final   Report Status 07/04/2016 FINAL  Final  Culture, blood (routine x 2)     Status: None   Collection Time: 06/29/16  6:23 PM  Result Value Ref Range Status   Specimen Description BLOOD RIGHT HAND  Final   Special Requests BOTTLES DRAWN AEROBIC AND ANAEROBIC 5CC  Final   Culture NO GROWTH 5 DAYS  Final   Report Status 07/04/2016 FINAL  Final  Urine culture     Status: None   Collection Time: 06/30/16  7:39 AM  Result Value Ref Range Status   Specimen Description URINE, RANDOM  Final   Special Requests NONE  Final   Culture NO GROWTH  Final   Report Status 07/01/2016 FINAL  Final  Culture, blood (routine x 2)     Status: None   Collection Time: 07/04/16  2:58 PM  Result Value Ref Range Status   Specimen Description BLOOD LEFT HAND  Final   Special Requests BOTTLES DRAWN AEROBIC ONLY 10CC  Final   Culture NO GROWTH 5  DAYS  Final   Report Status 07/09/2016 FINAL  Final  Culture, blood (routine x 2)     Status: None   Collection Time: 07/04/16  3:02 PM  Result Value Ref Range Status   Specimen Description BLOOD LEFT HAND  Final   Special Requests BOTTLES DRAWN AEROBIC ONLY 10CC  Final   Culture NO GROWTH 5 DAYS  Final   Report Status 07/09/2016 FINAL  Final  Culture, respiratory (NON-Expectorated)     Status: None   Collection Time: 07/04/16  4:03 PM  Result Value Ref Range Status   Specimen Description TRACHEAL ASPIRATE  Final   Special Requests NONE  Final   Gram Stain   Final    ABUNDANT WBC PRESENT, PREDOMINANTLY PMN RARE GRAM NEGATIVE RODS RARE YEAST    Culture FEW KLEBSIELLA OXYTOCA  Final   Report Status 07/06/2016 FINAL  Final   Organism ID, Bacteria KLEBSIELLA OXYTOCA  Final      Susceptibility   Klebsiella oxytoca  - MIC*    AMPICILLIN >=32 RESISTANT Resistant     CEFAZOLIN <=4 SENSITIVE Sensitive     CEFEPIME <=1 SENSITIVE Sensitive     CEFTAZIDIME <=1 SENSITIVE Sensitive     CEFTRIAXONE <=1 SENSITIVE Sensitive     CIPROFLOXACIN <=0.25 SENSITIVE Sensitive     GENTAMICIN <=1 SENSITIVE Sensitive     IMIPENEM <=0.25 SENSITIVE Sensitive     TRIMETH/SULFA <=20 SENSITIVE Sensitive     AMPICILLIN/SULBACTAM 8 SENSITIVE Sensitive     PIP/TAZO <=4 SENSITIVE Sensitive     Extended ESBL NEGATIVE Sensitive     * FEW KLEBSIELLA OXYTOCA  Culture, Urine     Status: Abnormal   Collection Time: 07/04/16  5:55 PM  Result Value Ref Range Status   Specimen Description URINE, RANDOM  Final   Special Requests NONE  Final   Culture >=100,000 COLONIES/mL YEAST (A)  Final   Report Status 07/05/2016 FINAL  Final  Culture, respiratory (NON-Expectorated)     Status: None   Collection Time: 07/09/16  2:26 PM  Result Value Ref Range Status   Specimen Description TRACHEAL ASPIRATE  Final   Special Requests NONE  Final   Gram Stain   Final    MODERATE WBC PRESENT, PREDOMINANTLY PMN NO ORGANISMS SEEN    Culture   Final    ABUNDANT PSEUDOMONAS AERUGINOSA MODERATE KLEBSIELLA OXYTOCA    Report Status 07/13/2016 FINAL  Final   Organism ID, Bacteria PSEUDOMONAS AERUGINOSA  Final   Organism ID, Bacteria KLEBSIELLA OXYTOCA  Final      Susceptibility   Klebsiella oxytoca - MIC*    AMPICILLIN >=32 RESISTANT Resistant     CEFAZOLIN <=4 SENSITIVE Sensitive     CEFEPIME <=1 SENSITIVE Sensitive     CEFTAZIDIME <=1 SENSITIVE Sensitive     CEFTRIAXONE <=1 SENSITIVE Sensitive     CIPROFLOXACIN <=0.25 SENSITIVE Sensitive     GENTAMICIN <=1 SENSITIVE Sensitive     IMIPENEM <=0.25 SENSITIVE Sensitive     TRIMETH/SULFA <=20 SENSITIVE Sensitive     AMPICILLIN/SULBACTAM 8 SENSITIVE Sensitive     PIP/TAZO <=4 SENSITIVE Sensitive     Extended ESBL NEGATIVE Sensitive     * MODERATE KLEBSIELLA OXYTOCA   Pseudomonas aeruginosa - MIC*     CEFTAZIDIME 4 SENSITIVE Sensitive     CIPROFLOXACIN 0.5 SENSITIVE Sensitive     GENTAMICIN <=1 SENSITIVE Sensitive     IMIPENEM 2 SENSITIVE Sensitive     PIP/TAZO 16 SENSITIVE Sensitive     CEFEPIME 4 SENSITIVE Sensitive     *  ABUNDANT PSEUDOMONAS AERUGINOSA  Culture, expectorated sputum-assessment     Status: None   Collection Time: 07/23/16 12:26 PM  Result Value Ref Range Status   Specimen Description EXPECTORATED SPUTUM  Final   Special Requests NONE  Final   Sputum evaluation THIS SPECIMEN IS ACCEPTABLE FOR SPUTUM CULTURE  Final   Report Status 07/23/2016 FINAL  Final  Culture, respiratory (NON-Expectorated)     Status: None   Collection Time: 07/23/16 12:26 PM  Result Value Ref Range Status   Specimen Description EXPECTORATED SPUTUM  Final   Special Requests NONE Reflexed from [id number]  Final   Gram Stain   Final    ABUNDANT WBC PRESENT, PREDOMINANTLY PMN FEW SQUAMOUS EPITHELIAL CELLS PRESENT FEW GRAM POSITIVE COCCI IN PAIRS RARE GRAM NEGATIVE COCCOBACILLI    Culture FEW PSEUDOMONAS AERUGINOSA  Final   Report Status 07/26/2016 FINAL  Final   Organism ID, Bacteria PSEUDOMONAS AERUGINOSA  Final      Susceptibility   Pseudomonas aeruginosa - MIC*    CEFTAZIDIME <=1 SENSITIVE Sensitive     CIPROFLOXACIN 2 INTERMEDIATE Intermediate     GENTAMICIN <=1 SENSITIVE Sensitive     IMIPENEM 8 INTERMEDIATE Intermediate     PIP/TAZO <=4 SENSITIVE Sensitive     CEFEPIME <=1 SENSITIVE Sensitive     * FEW PSEUDOMONAS AERUGINOSA  Culture, blood (routine x 2)     Status: None   Collection Time: 07/23/16  2:50 PM  Result Value Ref Range Status   Specimen Description BLOOD RIGHT HAND  Final   Special Requests IN PEDIATRIC BOTTLE 2.5CC  Final   Culture NO GROWTH 5 DAYS  Final   Report Status 07/28/2016 FINAL  Final  Culture, blood (routine x 2)     Status: Abnormal   Collection Time: 07/23/16  2:55 PM  Result Value Ref Range Status   Specimen Description BLOOD LEFT HAND  Final    Special Requests IN PEDIATRIC BOTTLE 2.5CC  Final   Culture  Setup Time   Final    GRAM POSITIVE COCCI IN CLUSTERS IN PEDIATRIC BOTTLE Organism ID to follow CRITICAL RESULT CALLED TO, READ BACK BY AND VERIFIED WITH: Ezzard Standing RN 14:55 07/24/16  ([email or web address])    Culture (A)  Final    STAPHYLOCOCCUS SPECIES (COAGULASE NEGATIVE) THE SIGNIFICANCE OF ISOLATING THIS ORGANISM FROM A SINGLE SET OF BLOOD CULTURES WHEN MULTIPLE SETS ARE DRAWN IS UNCERTAIN. PLEASE NOTIFY THE MICROBIOLOGY DEPARTMENT WITHIN ONE WEEK IF SPECIATION AND SENSITIVITIES ARE REQUIRED.    Report Status 07/25/2016 FINAL  Final  Blood Culture ID Panel (Reflexed)     Status: Abnormal   Collection Time: 07/23/16  2:55 PM  Result Value Ref Range Status   Enterococcus species NOT DETECTED NOT DETECTED Final   Listeria monocytogenes NOT DETECTED NOT DETECTED Final   Staphylococcus species DETECTED (A) NOT DETECTED Final    Comment: Methicillin (oxacillin) resistant coagulase negative staphylococcus. Possible blood culture contaminant (unless isolated from more than one blood culture draw or clinical case suggests pathogenicity). No antibiotic treatment is indicated for blood  culture contaminants. CRITICAL RESULT CALLED TO, READ BACK BY AND VERIFIED WITH: Ezzard Standing RN 14:55 07/24/16 ([email or web address])    Staphylococcus aureus NOT DETECTED NOT DETECTED Final   Methicillin resistance DETECTED (A) NOT DETECTED Final    Comment: CRITICAL RESULT CALLED TO, READ BACK BY AND VERIFIED WITH: Ezzard Standing RN 14:55 07/24/16 ([email or web address])    Streptococcus species NOT DETECTED NOT DETECTED Final   Streptococcus agalactiae NOT DETECTED NOT DETECTED Final   Streptococcus  pneumoniae NOT DETECTED NOT DETECTED Final   Streptococcus pyogenes NOT DETECTED NOT DETECTED Final   Acinetobacter baumannii NOT DETECTED NOT DETECTED Final   Enterobacteriaceae species NOT DETECTED NOT DETECTED Final   Enterobacter cloacae complex NOT DETECTED NOT DETECTED Final   Escherichia coli  NOT DETECTED NOT DETECTED Final   Klebsiella oxytoca NOT DETECTED NOT DETECTED Final   Klebsiella pneumoniae NOT DETECTED NOT DETECTED Final   Proteus species NOT DETECTED NOT DETECTED Final   Serratia marcescens NOT DETECTED NOT DETECTED Final   Haemophilus influenzae NOT DETECTED NOT DETECTED Final   Neisseria meningitidis NOT DETECTED NOT DETECTED Final   Pseudomonas aeruginosa NOT DETECTED NOT DETECTED Final   Candida albicans NOT DETECTED NOT DETECTED Final   Candida glabrata NOT DETECTED NOT DETECTED Final   Candida krusei NOT DETECTED NOT DETECTED Final   Candida parapsilosis NOT DETECTED NOT DETECTED Final   Candida tropicalis NOT DETECTED NOT DETECTED Final  Culture, Urine     Status: Abnormal   Collection Time: 07/23/16  3:05 PM  Result Value Ref Range Status   Specimen Description URINE, RANDOM  Final   Special Requests NONE  Final   Culture >=100,000 COLONIES/mL KLEBSIELLA PNEUMONIAE (A)  Final   Report Status 07/25/2016 FINAL  Final   Organism ID, Bacteria KLEBSIELLA PNEUMONIAE (A)  Final      Susceptibility   Klebsiella pneumoniae - MIC*    AMPICILLIN >=32 RESISTANT Resistant     CEFAZOLIN <=4 SENSITIVE Sensitive     CEFTRIAXONE <=1 SENSITIVE Sensitive     CIPROFLOXACIN >=4 RESISTANT Resistant     GENTAMICIN <=1 SENSITIVE Sensitive     IMIPENEM <=0.25 SENSITIVE Sensitive     NITROFURANTOIN 128 RESISTANT Resistant     TRIMETH/SULFA <=20 SENSITIVE Sensitive     AMPICILLIN/SULBACTAM 16 INTERMEDIATE Intermediate     PIP/TAZO 16 SENSITIVE Sensitive     Extended ESBL NEGATIVE Sensitive     * >=100,000 COLONIES/mL KLEBSIELLA PNEUMONIAE  Culture, blood (routine x 2)     Status: None   Collection Time: 07/26/16 12:23 PM  Result Value Ref Range Status   Specimen Description BLOOD RIGHT HAND  Final   Special Requests AEROBIC BOTTLE ONLY [place]  Final   Culture NO GROWTH 5 DAYS  Final   Report Status 07/31/2016 FINAL  Final  Culture, blood (routine x 2)     Status: None    Collection Time: 07/26/16 12:23 PM  Result Value Ref Range Status   Specimen Description ARM RIGHT  Final   Special Requests AEROBIC BOTTLE ONLY 5CC  Final   Culture NO GROWTH 5 DAYS  Final   Report Status 07/31/2016 FINAL  Final  Culture, Urine     Status: Abnormal   Collection Time: 07/27/16  4:11 AM  Result Value Ref Range Status   Specimen Description URINE, CATHETERIZED  Final   Special Requests NONE  Final   Culture MULTIPLE SPECIES PRESENT, SUGGEST RECOLLECTION (A)  Final   Report Status 07/28/2016 FINAL  Final    Coagulation Studies:  Recent Labs  08/01/16 0714  LABPROT 16.3*  INR 1.31    Urinalysis: No results for input(s): COLORURINE, LABSPEC, PHURINE, GLUCOSEU, HGBUR, BILIRUBINUR, KETONESUR, PROTEINUR, UROBILINOGEN, NITRITE, LEUKOCYTESUR in the last 72 hours.  Invalid input(s): APPERANCEUR    Imaging: No results found.   Medications:       Assessment/ Plan:  71 y.o.  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   1. Acute Renal failure / Solitary rt Pelvic Kidney 2. CKD st 3. Baseline Cr 1.27 (d/c from [facility]) Jun 29, 2016 3. B/L Pleural effusions and Acute respiratory failure requiring ventilator support 4. Anasarca/Generalized edema - Low albumin  - 3rd spacing of fluid  5. Fever/Bacteremia, coagulase negative staph noted in blood 6. Anemia of CKD. PRBC transfusion on 2/2  Plan:  Patient continues to have diminished renal function. As such he will need to continue renal replacement therapy at this point in time. HD Monday-WEd-Friday.   continue to follow lytes,  CBC. PEG tube was just placed.TF @ 40 cc/hr.  As before patient has a very guarded prognosis given the  suspected malignancy, history of MAC infection, acute renal failure, and generalized debility now.   LOS: 0 Arjan Strohm 2/19/20184:23 PM

## 2016-08-04 LAB — TYPE AND SCREEN
Blood Product Expiration Date: 201803202359
ISSUE DATE / TIME: 201802190958
Unit Type and Rh: 5100

## 2016-08-04 LAB — HEPATITIS B SURFACE ANTIGEN: Hepatitis B Surface Ag: NEGATIVE

## 2016-08-05 LAB — RENAL FUNCTION PANEL
ANION GAP: 13 (ref 5–15)
Albumin: 2.2 g/dL — ABNORMAL LOW (ref 3.5–5.0)
BUN: 70 mg/dL — ABNORMAL HIGH (ref 6–20)
CHLORIDE: 90 mmol/L — AB (ref 101–111)
CO2: 26 mmol/L (ref 22–32)
Calcium: 8.2 mg/dL — ABNORMAL LOW (ref 8.9–10.3)
Creatinine, Ser: 5.5 mg/dL — ABNORMAL HIGH (ref 0.61–1.24)
GFR calc Af Amer: 11 mL/min — ABNORMAL LOW (ref >60)
GFR calc non Af Amer: 9 mL/min — ABNORMAL LOW (ref >60)
Glucose, Bld: 244 mg/dL — ABNORMAL HIGH (ref 65–99)
POTASSIUM: 4.4 mmol/L (ref 3.5–5.1)
Phosphorus: 5.8 mg/dL — ABNORMAL HIGH (ref 2.5–4.6)
Sodium: 129 mmol/L — ABNORMAL LOW (ref 135–145)

## 2016-08-05 LAB — CBC
HEMATOCRIT: 22.4 % — AB (ref 39.0–52.0)
HEMOGLOBIN: 7.3 g/dL — AB (ref 13.0–17.0)
MCH: 29.2 pg (ref 26.0–34.0)
MCHC: 32.6 g/dL (ref 30.0–36.0)
MCV: 89.6 fL (ref 78.0–100.0)
Platelets: 168 10*3/uL (ref 150–400)
RBC: 2.5 MIL/uL — ABNORMAL LOW (ref 4.22–5.81)
RDW: 16.3 % — ABNORMAL HIGH (ref 11.5–15.5)
WBC: 8.4 10*3/uL (ref 4.0–10.5)

## 2016-08-13 DEATH — deceased

## 2018-01-15 IMAGING — CR DG CHEST 1V PORT
2 series · 2 of 2 positions shown · non-contrast
Comparison: One-view chest x-ray 07/01/2016

CLINICAL DATA: Endotracheal tube.  Pulmonary edema.

EXAM:
PORTABLE CHEST 1 VIEW

[AP (1 of 2)]
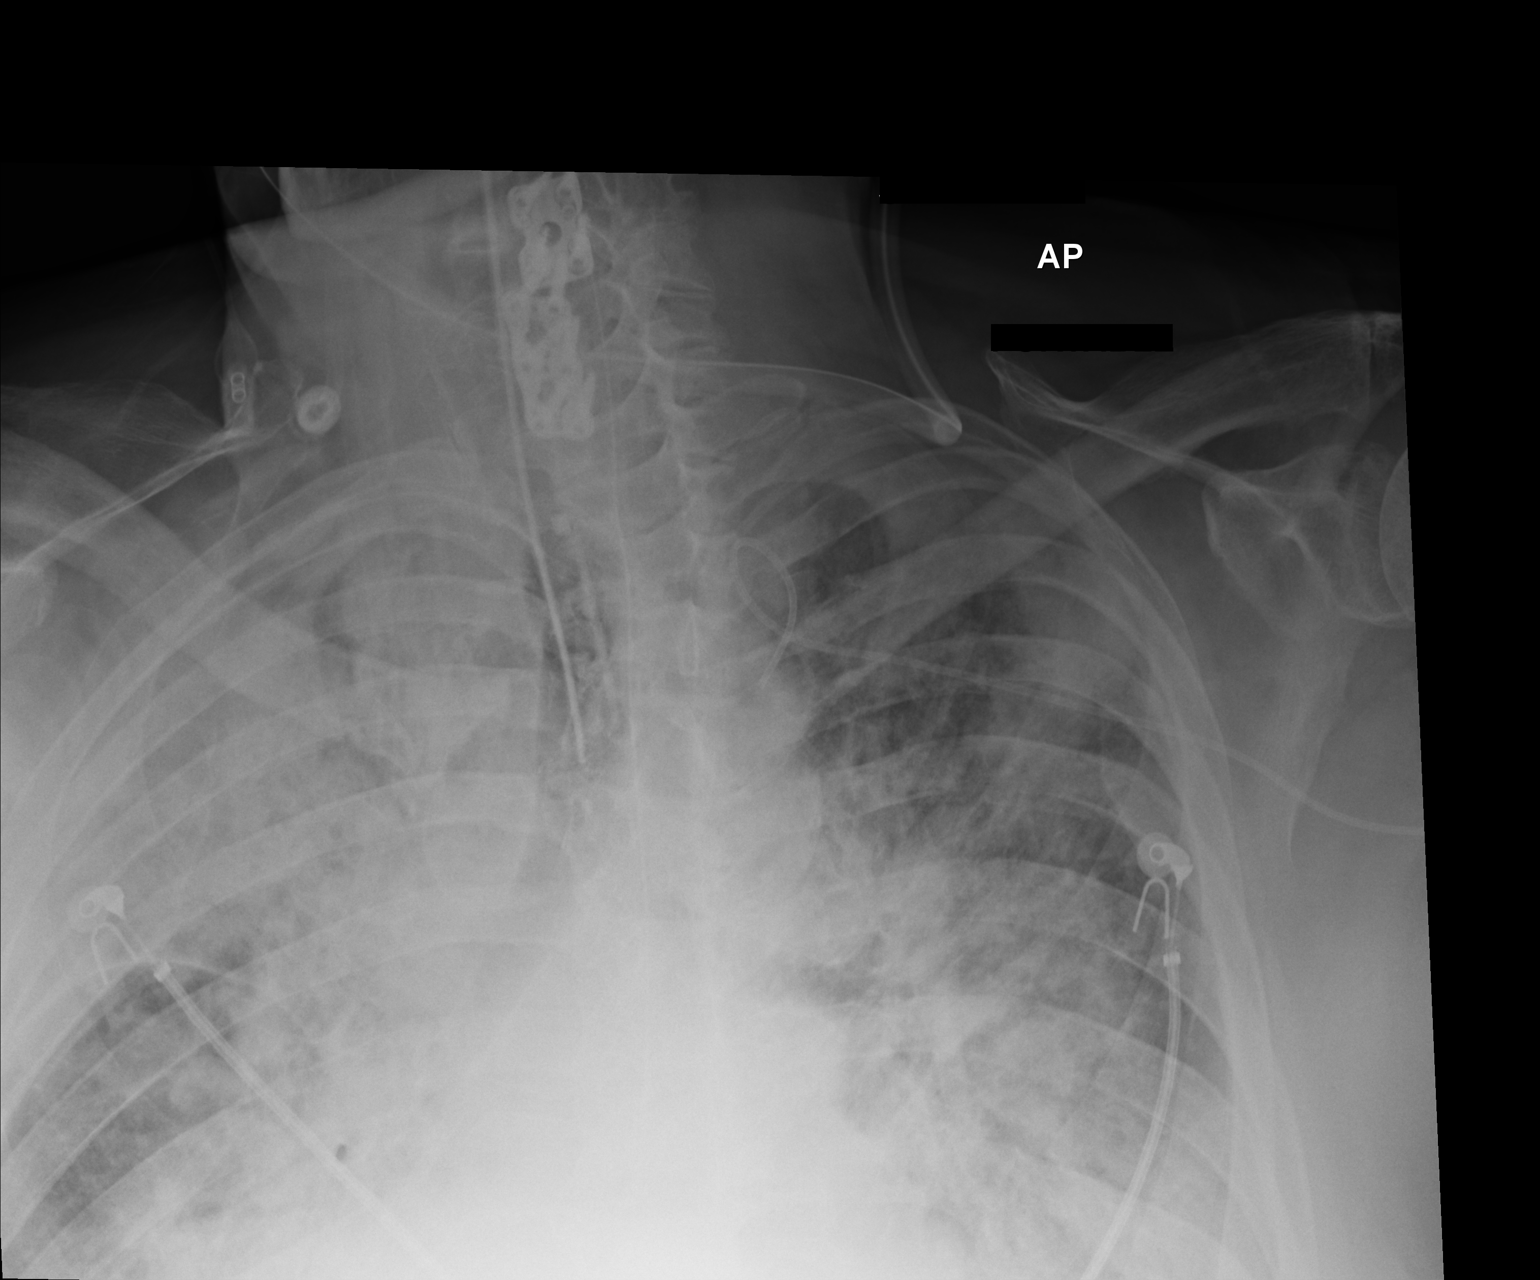

[AP (2 of 2)]
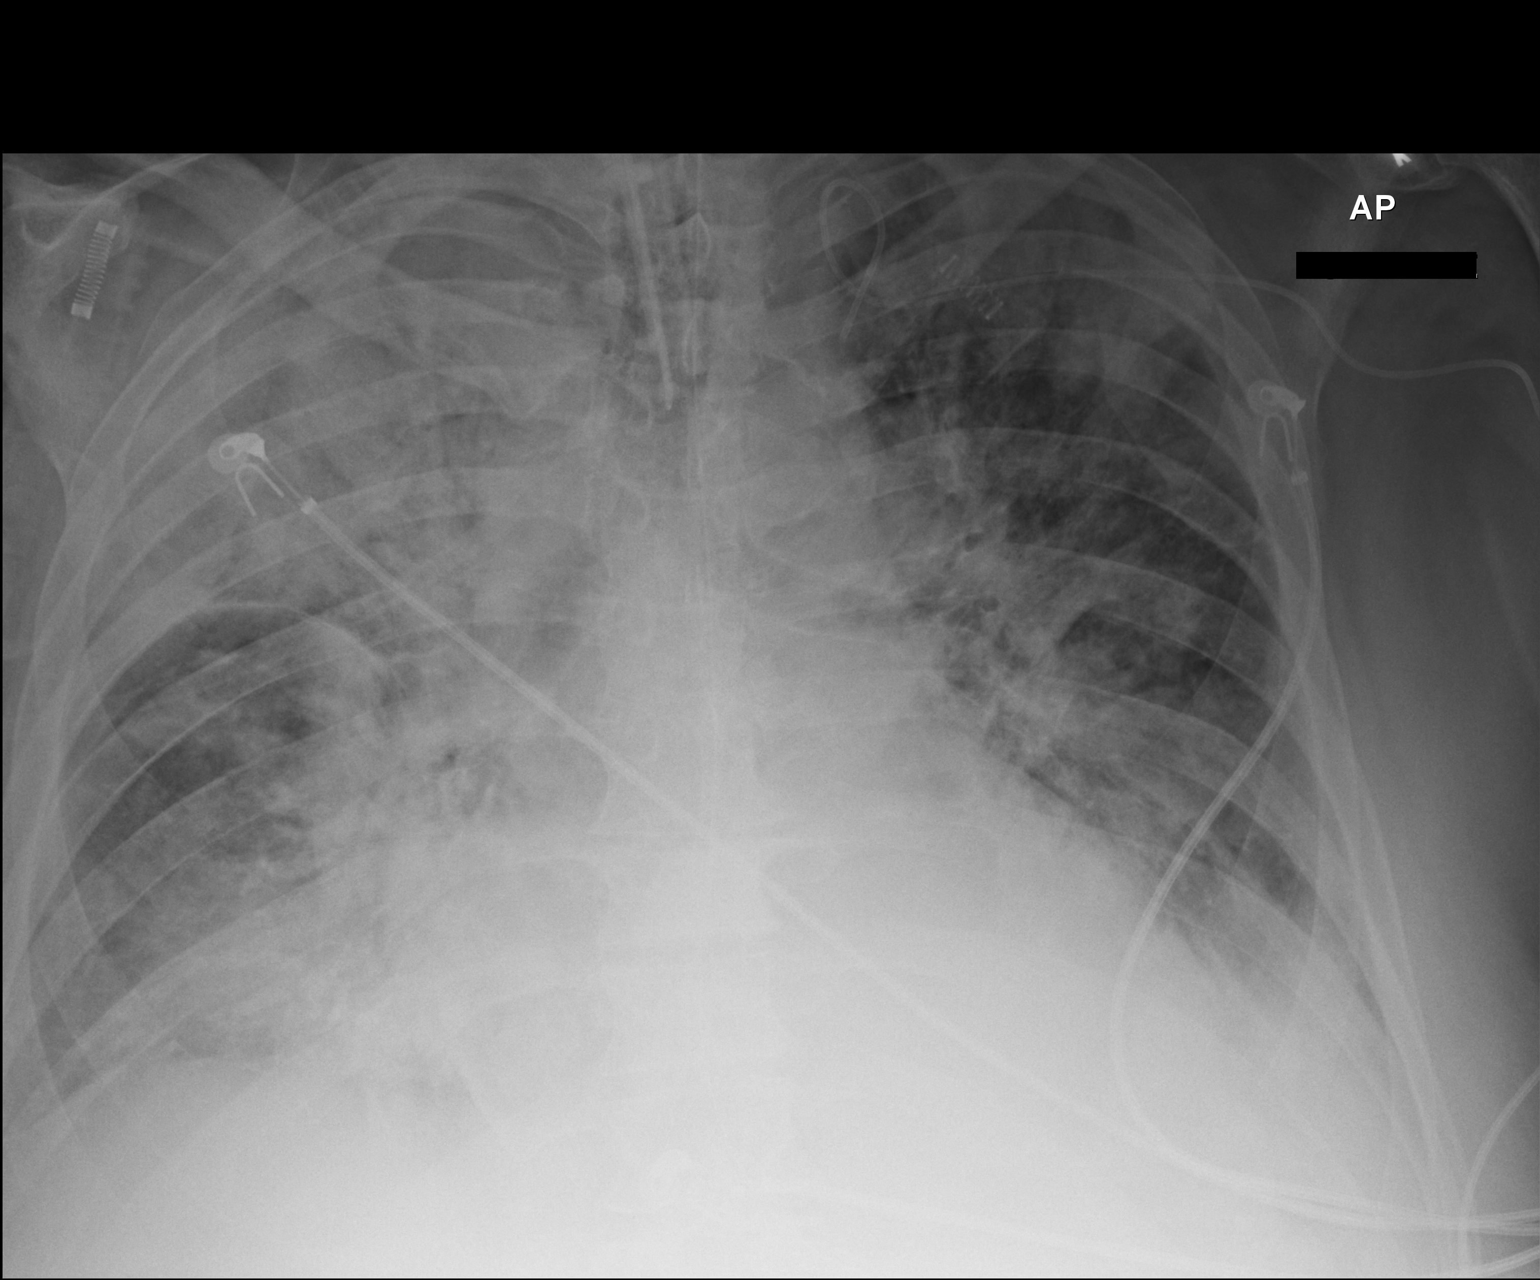

[2 of 2 positions shown; findings below may reference images not displayed]

FINDINGS: Endotracheal tube now terminates 3.3 cm above the carina, in
satisfactory position. Right upper lobe airspace consolidation has
progressed. Right lower lobe airspace disease is worse as well.
There is increased diffuse edema. The heart is enlarged. Bilateral
pleural effusions have progressed. A left-sided PICC line is stable
in position. There is a loop that extends superiorly within the left
innominate vein. The tip is directed caudal.
IMPRESSION: 1. Increasing interstitial and airspace disease as well is bilateral
pleural effusions with in the setting of cardiac enlargement
compatible with congestive heart failure.
2. The endotracheal tube now terminates 3.3 cm above carina, in
satisfactory position.
3. The left-sided PICC line remains looped within the innominate
vein.
4. Progressive right upper lower lobe airspace consolidation
concerning for pneumonia.

## 2018-01-16 IMAGING — CR DG CHEST 1V PORT
2 series · 2 of 2 positions shown · non-contrast
Comparison: July 03, 2016

CLINICAL DATA: Pneumonia.

EXAM:
PORTABLE CHEST 1 VIEW

[AP (1 of 2)]
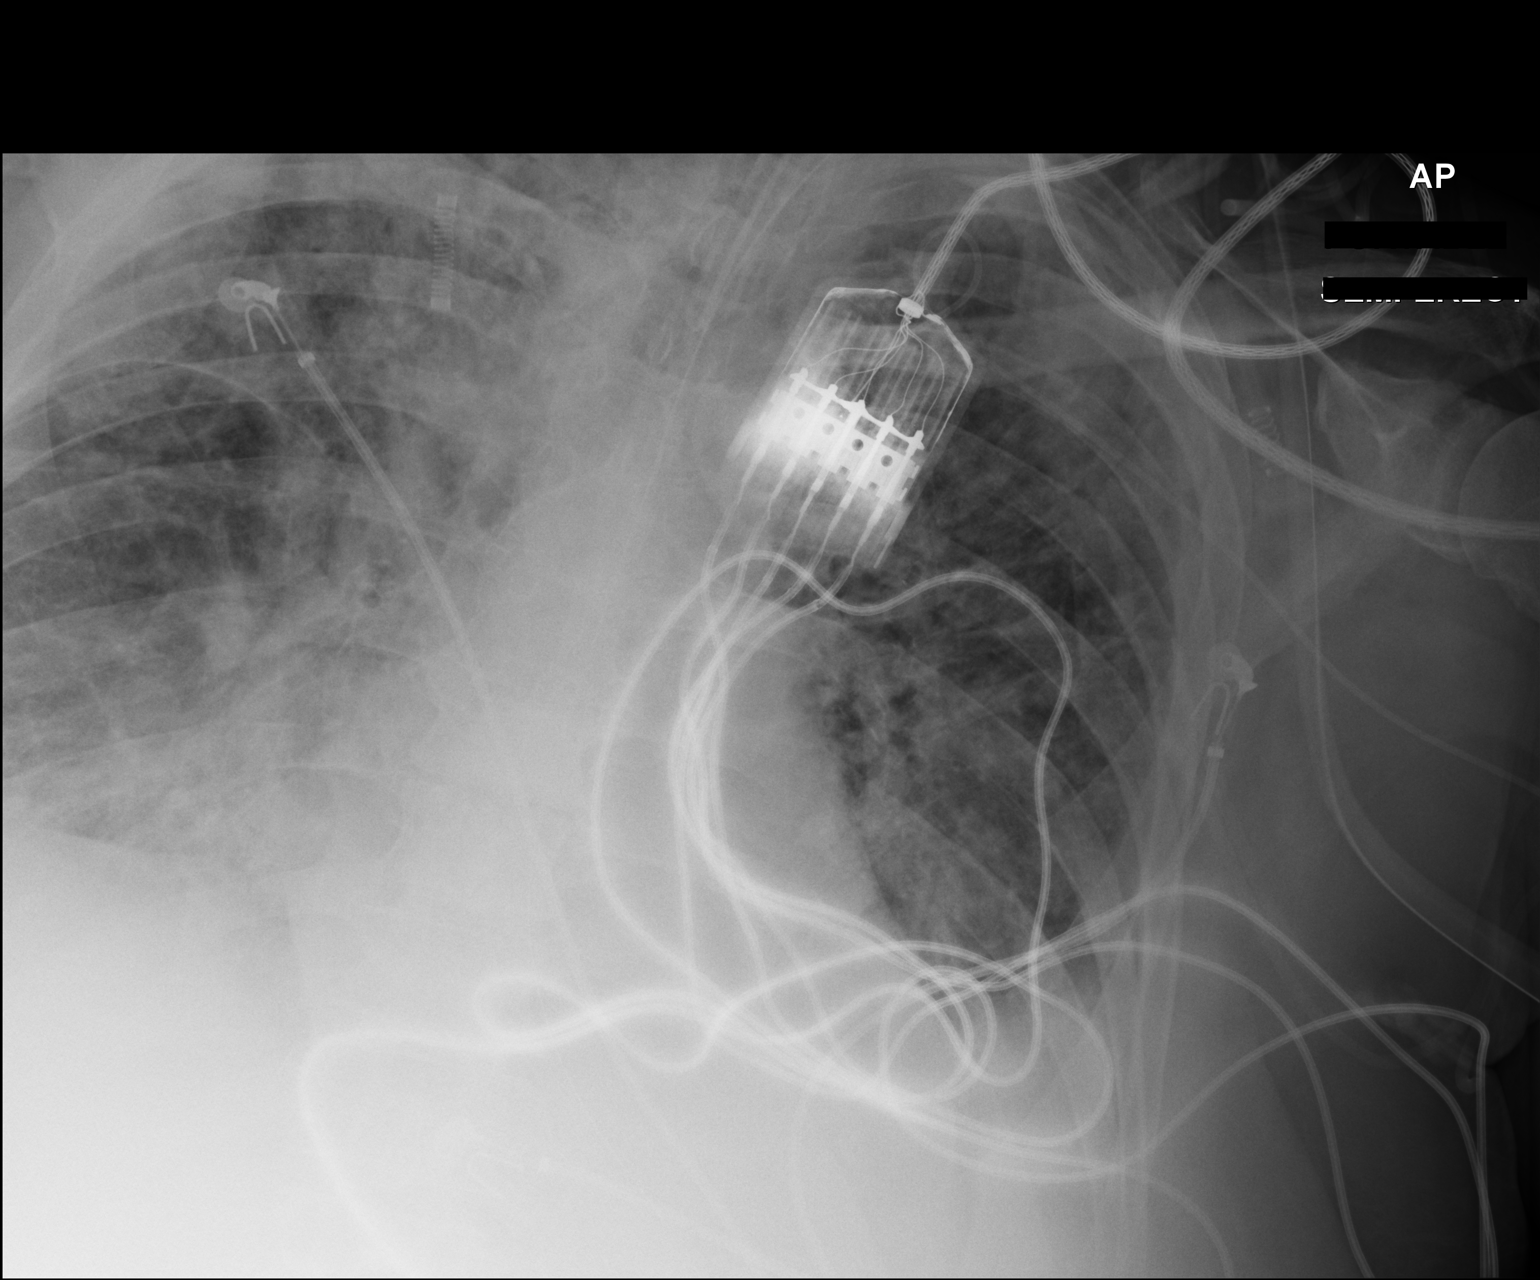

[AP (2 of 2)]
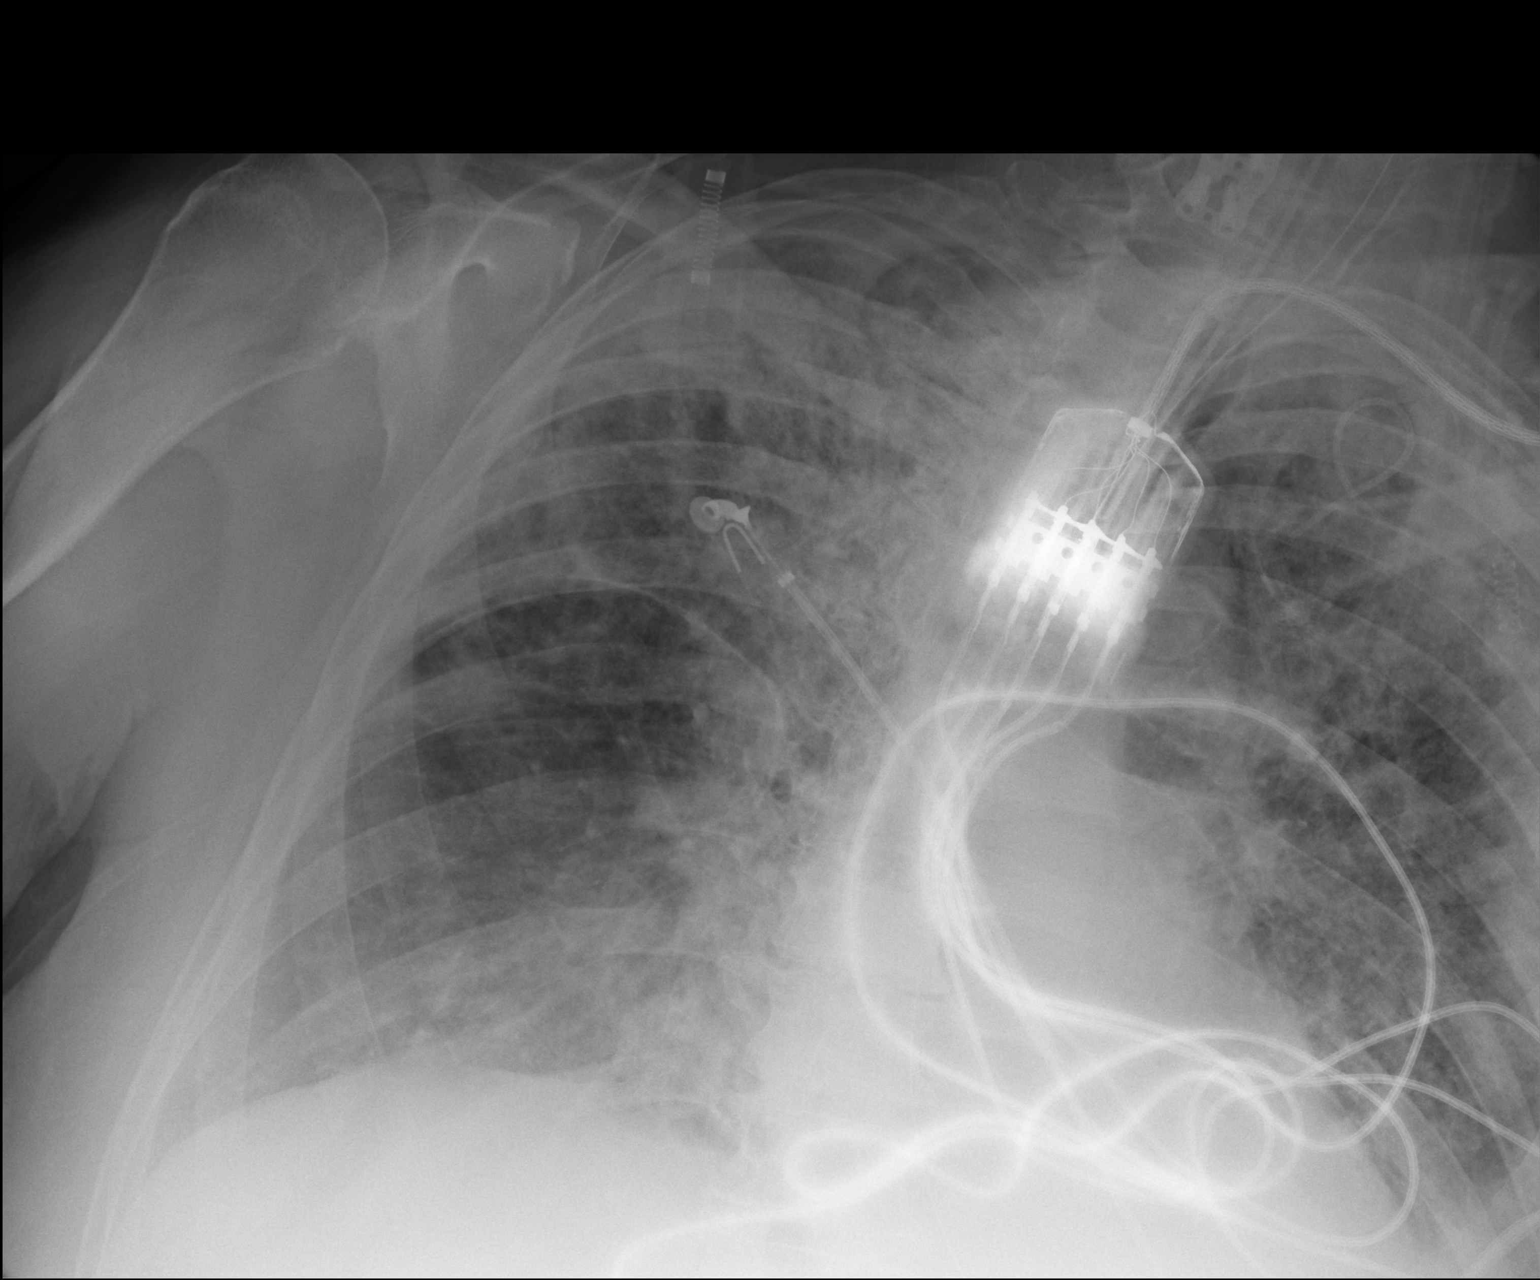

[2 of 2 positions shown; findings below may reference images not displayed]

FINDINGS: The ETT is in good position. The NG tube is not well seen distally
due to poor penetration but appears to terminate below the
diaphragm. No pneumothorax. Diffuse bilateral pulmonary opacities
persist, improved in the interval. No change in the
cardiomediastinal silhouette.
IMPRESSION: 1. Support apparatus as above.
2. Diffuse bilateral pulmonary opacities persist but there has been
significant interval improvement.

## 2018-01-18 IMAGING — CR DG CHEST 1V PORT
2 series · 2 of 2 positions shown · non-contrast
Comparison: July 05, 2016

CLINICAL DATA: Pulmonary edema

EXAM:
PORTABLE CHEST 1 VIEW

[AP (1 of 2)]
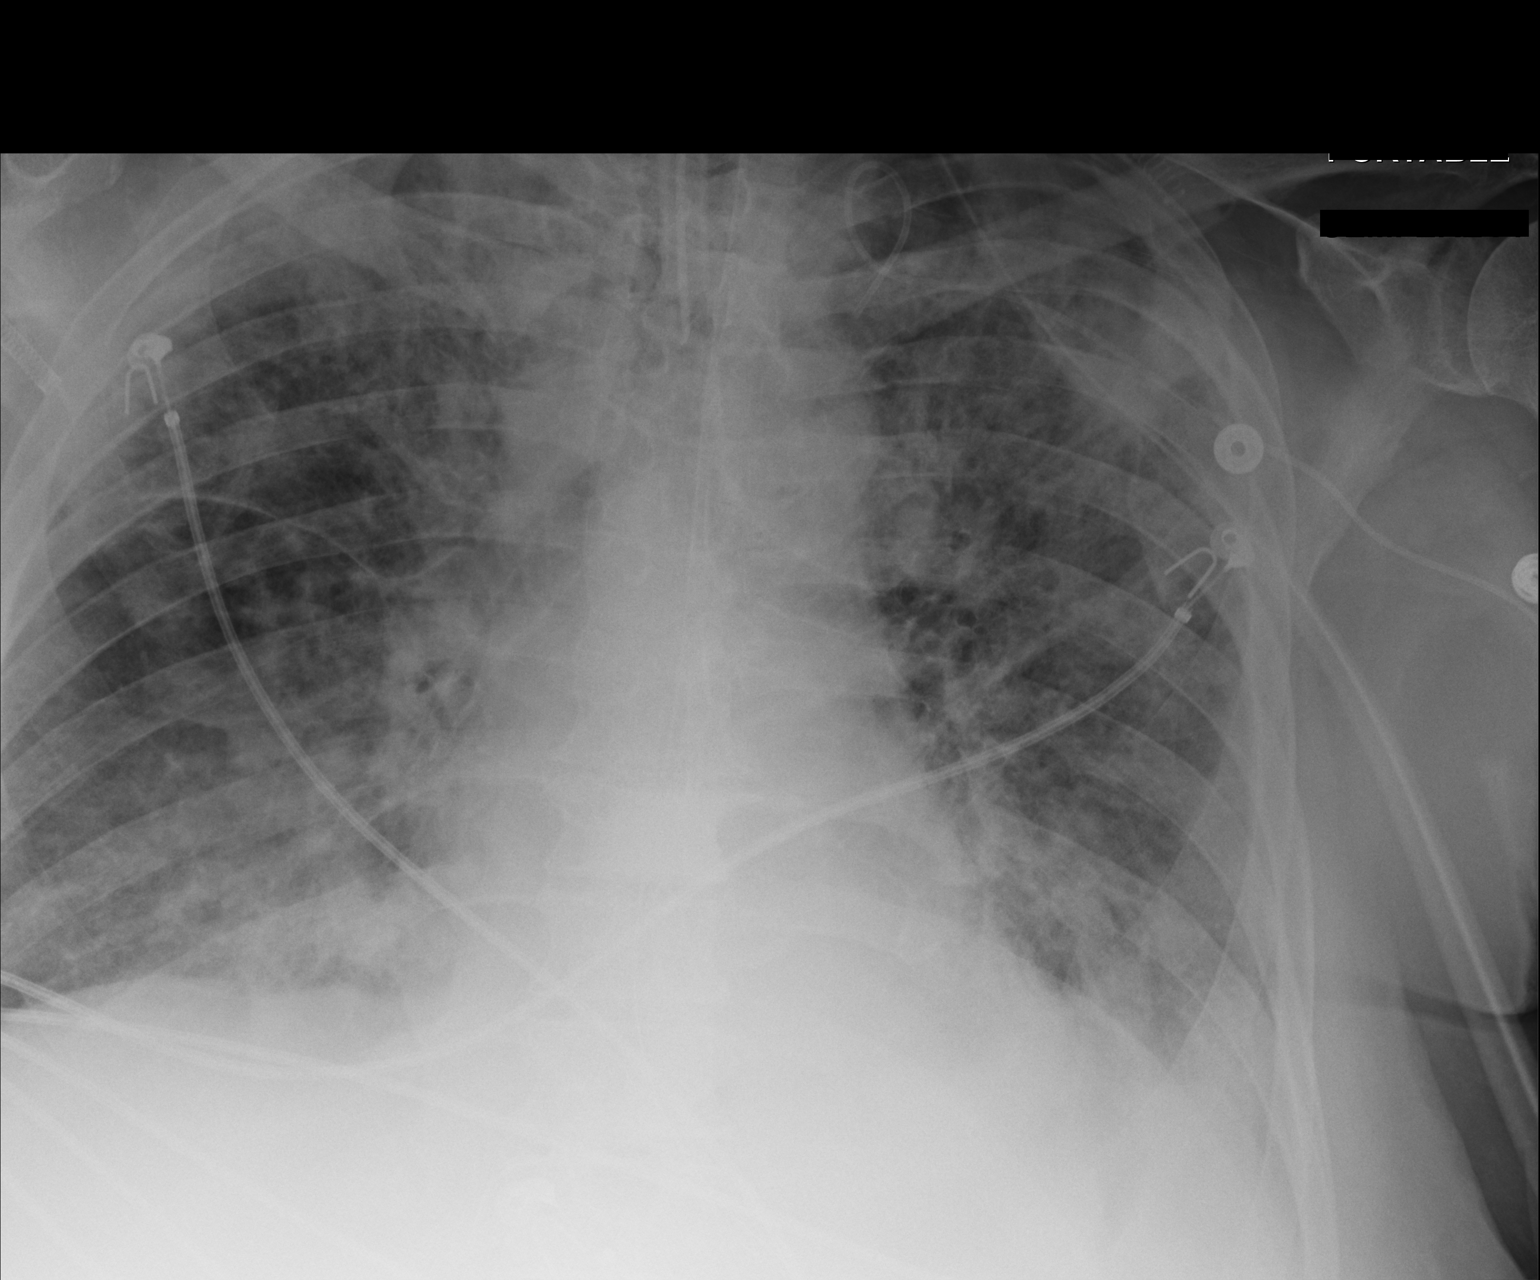

[AP (2 of 2)]
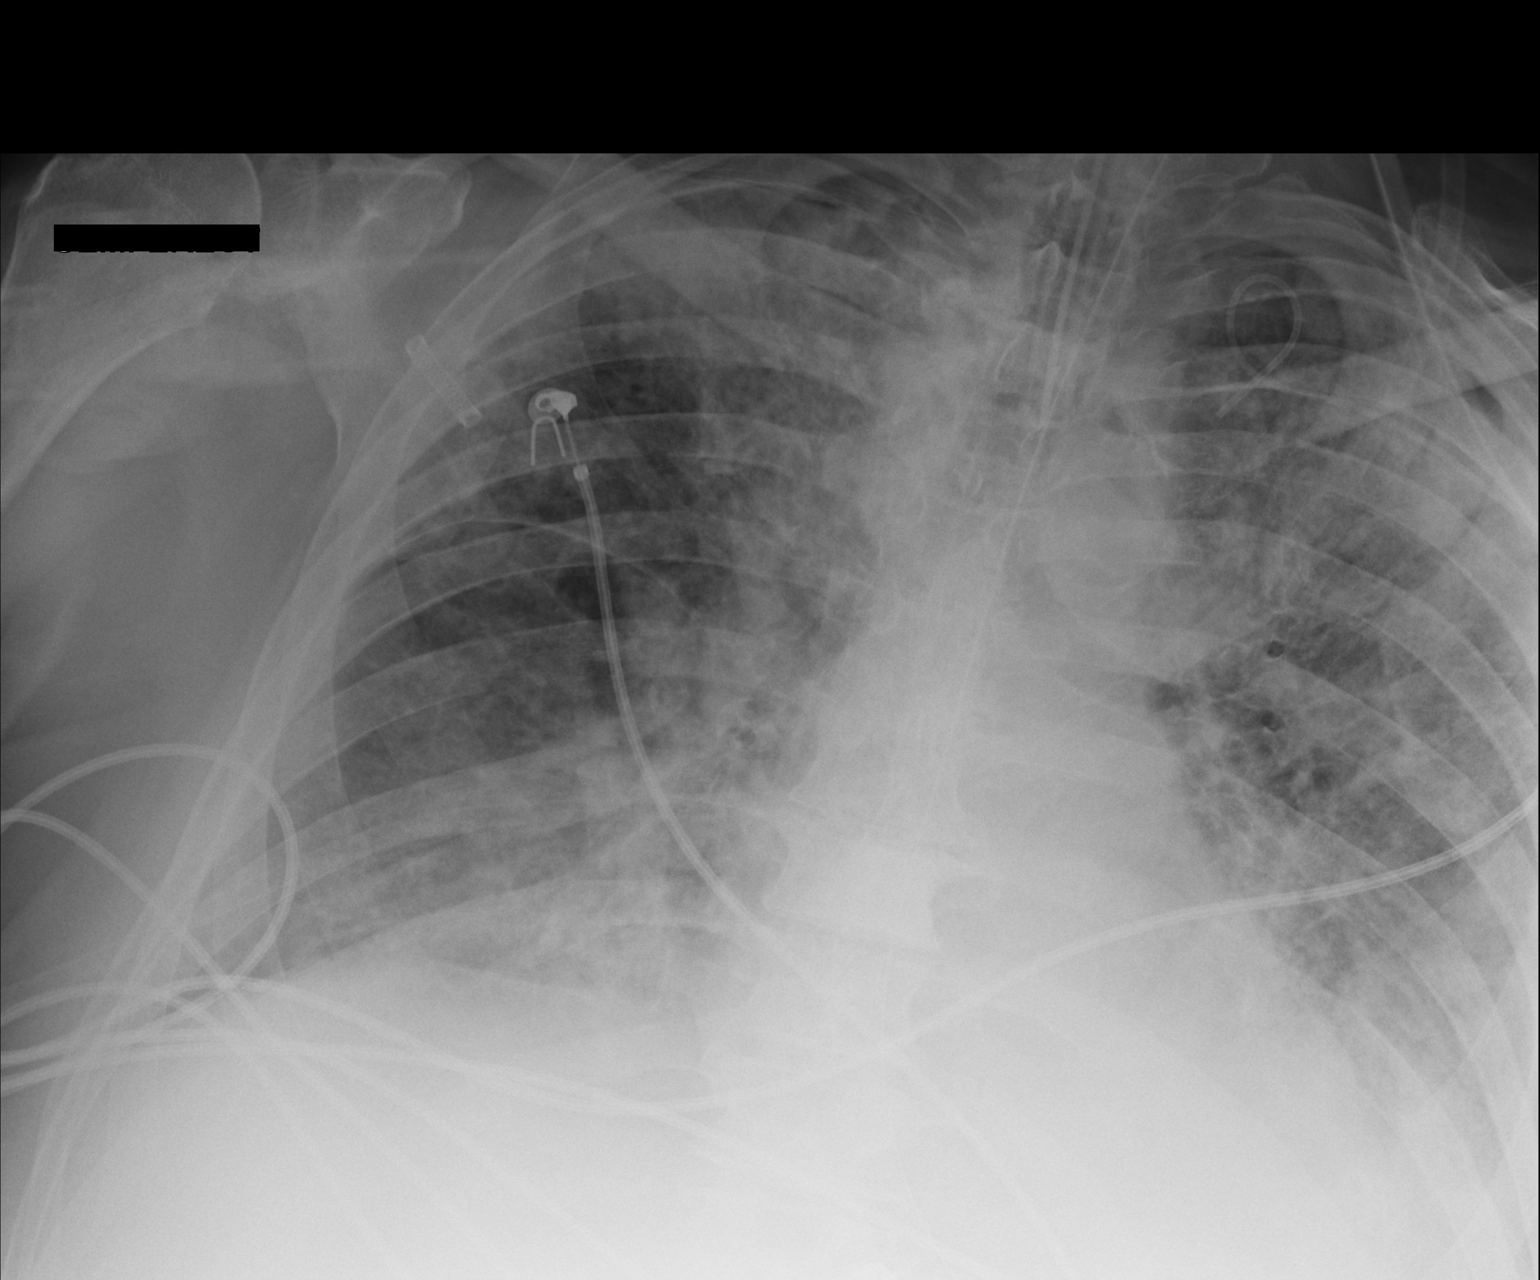

[2 of 2 positions shown; findings below may reference images not displayed]

FINDINGS: Endotracheal tube tip is 3.0 cm above the carina. The central
catheter loops on itself in the left subclavian region, unchanged.
Nasogastric tube tip and side port are below the diaphragm. No
pneumothorax evident.

Widespread interstitial and patchy alveolar pulmonary edema remain.
In comparison with 1 day prior, there is new airspace consolidation
in the periphery of the left upper lobe. Otherwise opacities appear
stable bilaterally. There is cardiomegaly with pulmonary venous
hypertension. There are small pleural effusions bilaterally. There
is aortic atherosclerosis. No evident adenopathy.
IMPRESSION: Tube and catheter positions as described without pneumothorax. Note
that the central catheter loops on itself in the left subclavian
region, stable. Evidence of congestive heart failure with overall
degree of interstitial and patchy alveolar edema stable compared to
1 day prior. There is, however, new airspace opacity in the
periphery of the left upper lobe, concerning for a potential focus
of superimposed pneumonia.
